# Patient Record
Sex: Male | Born: 1980
Health system: Southern US, Community
[De-identification: ages and names within clinical notes are randomized; demographics above are authoritative.]

## PROBLEM LIST (undated history)

## (undated) DIAGNOSIS — J189 Pneumonia, unspecified organism: Secondary | ICD-10-CM

## (undated) DIAGNOSIS — S82142A Displaced bicondylar fracture of left tibia, initial encounter for closed fracture: Secondary | ICD-10-CM

## (undated) HISTORY — PX: ARTERY REPAIR: SHX559

## (undated) HISTORY — PX: HERNIA REPAIR: SHX51

## (undated) HISTORY — PX: WISDOM TOOTH EXTRACTION: SHX21

---

## 2002-11-15 ENCOUNTER — Encounter: Payer: Self-pay | Admitting: Emergency Medicine

## 2002-11-15 ENCOUNTER — Emergency Department (HOSPITAL_COMMUNITY): Admission: EM | Admit: 2002-11-15 | Discharge: 2002-11-15 | Payer: Self-pay | Admitting: Emergency Medicine

## 2012-07-08 ENCOUNTER — Encounter: Payer: Self-pay | Admitting: Emergency Medicine

## 2012-07-08 ENCOUNTER — Emergency Department
Admission: EM | Admit: 2012-07-08 | Discharge: 2012-07-08 | Disposition: A | Discharge status: Home / Self Care | Payer: 59 | Source: Home / Self Care | Attending: Family Medicine | Admitting: Family Medicine

## 2012-07-08 DIAGNOSIS — H1130 Conjunctival hemorrhage, unspecified eye: Secondary | ICD-10-CM

## 2012-07-08 DIAGNOSIS — H1132 Conjunctival hemorrhage, left eye: Secondary | ICD-10-CM

## 2012-07-08 MED ORDER — KETOROLAC TROMETHAMINE 0.5 % OP SOLN: 1.0000 [drp] | Freq: Four times a day (QID) | OPHTHALMIC | Status: DC

## 2012-07-08 NOTE — ED Notes (Signed)
Patient believes he got something like hair or dust in his right eye last evening; used 'visine', but it 'stung'. Was not working with metal or wood.

## 2012-07-08 NOTE — ED Provider Notes (Signed)
History     CSN: [id number]  Arrival date & time 07/08/12  1831   First MD Initiated Contact with Patient 07/08/12 1931      Chief Complaint  Patient presents with  . Foreign Body in Eye       HPI Comments: Yesterday evening patient developed a sudden foreign body sensation in the medial aspect of his right eye.  The discomfort has persisted.  He had no improvement after usin Visine eye drops.  No change in vision  Patient is a 32 y.o. male presenting with eye pain. The history is provided by the patient.  Eye Pain This is a new problem. The current episode started yesterday. The problem occurs constantly. The problem has not changed since onset.Associated symptoms comments: none. Exacerbated by: blinking right eye. Nothing relieves the symptoms. Treatments tried: Visine drops. The treatment provided no relief.    History reviewed. No pertinent past medical history.  Past Surgical History  Procedure Laterality Date  . Artery repair      right wrist  . Hernia repair      History reviewed. No pertinent family history.  History  Substance Use Topics  . Smoking status: Former Games developer  . Smokeless tobacco: Not on file  . Alcohol Use: No      Review of Systems  Eyes: Positive for pain.  All other systems reviewed and are negative.    Allergies  Ceclor  Home Medications   Current Outpatient Rx  Name  Route  Sig  Dispense  Refill  . ketorolac (ACULAR) 0.5 % ophthalmic solution   Left Eye   Place 1 drop into the left eye 4 (four) times daily.   3 mL   0     BP 109/68  Pulse 76  Temp(Src) 98 F (36.7 C) (Oral)  Ht 6\' 1"  (1.854 m)  Wt 235 lb (106.595 kg)  BMI 31.01 kg/m2  SpO2 98%  Physical Exam  Nursing note and vitals reviewed. Constitutional: He appears well-developed and well-nourished. No distress.  HENT:  Head: Normocephalic.  Eyes: EOM and lids are normal. Pupils are equal, round, and reactive to light. No foreign bodies found. Right eye exhibits  no chemosis, no discharge, no exudate and no hordeolum. No foreign body present in the right eye. Left eye exhibits no discharge. Right conjunctiva is not injected. Right conjunctiva has a hemorrhage. No scleral icterus.    Over the medial conjunctivae, as noted on diagram, is a hyperemic area that is fluorescein positive.  No lid swelling or tenderness.   No photophobia.    ED Course  Procedures  none      1. Subconjunctival hemorrhage, left; could also be inflamed pterygium      MDM  Begin Acular Followup with ophthalmologist if not improved one week.        Lattie Haw, MD 07/13/12 1249

## 2013-10-04 ENCOUNTER — Emergency Department
Admission: EM | Admit: 2013-10-04 | Discharge: 2013-10-04 | Disposition: A | Discharge status: Home / Self Care | Payer: 59 | Source: Home / Self Care | Attending: Family Medicine | Admitting: Family Medicine

## 2013-10-04 ENCOUNTER — Emergency Department (INDEPENDENT_AMBULATORY_CARE_PROVIDER_SITE_OTHER): Payer: 59

## 2013-10-04 ENCOUNTER — Ambulatory Visit (INDEPENDENT_AMBULATORY_CARE_PROVIDER_SITE_OTHER): Payer: 59 | Admitting: Sports Medicine

## 2013-10-04 ENCOUNTER — Encounter: Payer: Self-pay | Admitting: Emergency Medicine

## 2013-10-04 DIAGNOSIS — M25469 Effusion, unspecified knee: Secondary | ICD-10-CM

## 2013-10-04 DIAGNOSIS — M25569 Pain in unspecified knee: Secondary | ICD-10-CM

## 2013-10-04 DIAGNOSIS — M222X1 Patellofemoral disorders, right knee: Secondary | ICD-10-CM

## 2013-10-04 DIAGNOSIS — M25461 Effusion, right knee: Secondary | ICD-10-CM | POA: Insufficient documentation

## 2013-10-04 NOTE — Assessment & Plan Note (Addendum)
Likely represents patellofemoral chondromalacia. Aspiration of 52 cc, injection, formal PT. Return to see me in a month.

## 2013-10-04 NOTE — ED Provider Notes (Signed)
CSN: [id number]     Arrival date & time 10/04/13  1423 History   First MD Initiated Contact with Patient 10/04/13 1432     Chief Complaint  Patient presents with  . Knee Pain      HPI Comments: Patient recalls climbing multiple stairs about two weeks ago.  Since then he has had persistent tightness and discomfort in his right knee.  He recalls no injury.  He admits that he has gained about 30 pounds over the past two years.  Patient is a 33 y.o. male presenting with knee pain. The history is provided by the patient.  Knee Pain Location:  Knee Time since incident:  2 weeks Injury: no   Knee location:  R knee Pain details:    Quality:  Aching   Radiates to:  Does not radiate   Severity:  Mild   Onset quality:  Gradual   Duration:  2 weeks   Timing:  Constant   Progression:  Unchanged Chronicity:  New Dislocation: no   Prior injury to area:  No Worsened by:  Flexion Ineffective treatments:  None tried Associated symptoms: decreased ROM, stiffness and swelling   Associated symptoms: no back pain, no fever, no muscle weakness, no numbness and no tingling     History reviewed. No pertinent past medical history. Past Surgical History  Procedure Laterality Date  . Artery repair      right wrist  . Hernia repair     History reviewed. No pertinent family history. History  Substance Use Topics  . Smoking status: Former Games developer  . Smokeless tobacco: Never Used  . Alcohol Use: Yes     Comment: 2-3    Review of Systems  Constitutional: Negative for fever.  Musculoskeletal: Positive for stiffness. Negative for back pain.  All other systems reviewed and are negative.   Allergies  Ceclor  Home Medications   Prior to Admission medications   Medication Sig Start Date End Date Taking? Authorizing Provider  ketorolac (ACULAR) 0.5 % ophthalmic solution Place 1 drop into the left eye 4 (four) times daily. 07/08/12   Lattie Haw, MD   BP 121/79  Pulse 71  Temp(Src) 98.1 F  (36.7 C) (Oral)  Ht 6\' 1"  (1.854 m)  Wt 258 lb 8 oz (117.255 kg)  BMI 34.11 kg/m2  SpO2 98% Physical Exam  Nursing note and vitals reviewed. Constitutional: He is oriented to person, place, and time. He appears well-developed and well-nourished. No distress.  Patient is obese (BMI 34.1)  HENT:  Head: Normocephalic.  Eyes: Conjunctivae are normal. Pupils are equal, round, and reactive to light.  Musculoskeletal:       Right knee: He exhibits effusion and abnormal meniscus. He exhibits normal range of motion, no swelling, no ecchymosis, no deformity, no erythema, no LCL laxity, normal patellar mobility and no MCL laxity. Tenderness found. No medial joint line, no lateral joint line, no MCL, no LCL and no patellar tendon tenderness noted.       Legs: Right knee has mild tenderness to palpation and crepitance just superior to the patella as noted on diagram.    Neurological: He is alert and oriented to person, place, and time.  Skin: Skin is warm and dry. No erythema.    ED Course  Procedures  none     Imaging Review Dg Knee 1-2 Views Right  10/04/2013   CLINICAL DATA:  33 year old male with pain above the patella and swelling with limited range of motion. Initial encounter.  EXAM: RIGHT KNEE - 1-2 VIEW  COMPARISON:  None.  FINDINGS: Small to moderate joint effusion. Patella appears intact. Bone mineralization is within normal limits. Joint spaces appear preserved. No acute fracture or dislocation identified.  IMPRESSION: Moderate joint effusion with no acute osseous abnormality identified at the right knee.   Electronically Signed   By: Augusto GambleLee  Hall M.D.   On: 10/04/2013 15:10     MDM   1. Right patellofemoral syndrome    With the presence of a significant joint effusion, will refer to Dr. Rodney Langtonhomas Thekkekandam for ultrasound guided joint aspiration and further management.    Lattie HawStephen A Yuma Pacella, MD 10/06/13 [id number]

## 2013-10-04 NOTE — ED Notes (Signed)
R knee pain beginning approx 2 weeks ago.  C/O tightness and it feels like it's swollen but nothing is visible.  Has not noticed any warmth in area. Denies injury.  Stands most of day at work. Decreased ROM

## 2013-10-04 NOTE — Progress Notes (Signed)
   Subjective:    I'm seeing this patient as a consultation for:  Dr. Cathren Harsh  CC: Right knee pain  HPI: This is a very pleasant 33 year old male, over the past several months he's gained some weight, more recently went on a hike, and had to seek a pain under his patella, worse with going up and down stairs, with significant swelling but no mechanical symptoms. Pain is moderate, persistent.  Past medical history, Surgical history, Family history not pertinant except as noted below, Social history, Allergies, and medications have been entered into the medical record, reviewed, and no changes needed.   Review of Systems: No headache, visual changes, nausea, vomiting, diarrhea, constipation, dizziness, abdominal pain, skin rash, fevers, chills, night sweats, weight loss, swollen lymph nodes, body aches, joint swelling, muscle aches, chest pain, shortness of breath, mood changes, visual or auditory hallucinations.   Objective:   General: Well Developed, well nourished, and in no acute distress.  Neuro/Psych: Alert and oriented x3, extra-ocular muscles intact, able to move all 4 extremities, sensation grossly intact. Skin: Warm and dry, no rashes noted.  Respiratory: Not using accessory muscles, speaking in full sentences, trachea midline.  Cardiovascular: Pulses palpable, no extremity edema. Abdomen: Does not appear distended. Right Knee: Visible and palpable effusion with a fluid wave. ROM full in flexion and extension and lower leg rotation. Ligaments with solid consistent endpoints including ACL, PCL, LCL, MCL. Negative Mcmurray's, Apley's, and Thessalonian tests. Non painful patellar compression. Tender to palpation under the lateral patellar facet Patellar glide without crepitus. Patellar and quadriceps tendons unremarkable. Hamstring and quadriceps strength is normal.   Procedure: Real-time Ultrasound Guided Injection of right knee Device: GE Logiq E  Verbal informed consent  obtained.  Time-out conducted.  Noted no overlying erythema, induration, or other signs of local infection.  Skin prepped in a sterile fashion.  Local anesthesia: Topical Ethyl chloride.  With sterile technique and under real time ultrasound guidance:  22-gauge needle advanced into the suprapatellar recess, 52 cc of straw-colored fluid was aspirated, syringe switched and 2 cc Kenalog 40, 4 cc lidocaine injected easily. Completed without difficulty  Pain immediately resolved suggesting accurate placement of the medication.  Advised to call if fevers/chills, erythema, induration, drainage, or persistent bleeding.  Images permanently stored and available for review in the ultrasound unit.  Impression: Technically successful ultrasound guided injection.  Knee x-rays were reviewed and showed an effusion but no arthritis, there was no patellar sunrise view.  Impression and Recommendations:   This case required medical decision making of moderate complexity.

## 2013-10-06 NOTE — Discharge Instructions (Signed)

## 2014-02-07 ENCOUNTER — Encounter: Payer: Self-pay | Admitting: Emergency Medicine

## 2014-02-07 ENCOUNTER — Emergency Department
Admission: EM | Admit: 2014-02-07 | Discharge: 2014-02-07 | Disposition: A | Discharge status: Home / Self Care | Payer: 59 | Source: Home / Self Care | Attending: Emergency Medicine | Admitting: Emergency Medicine

## 2014-02-07 DIAGNOSIS — J029 Acute pharyngitis, unspecified: Secondary | ICD-10-CM

## 2014-02-07 LAB — POCT RAPID STREP A (OFFICE): Rapid Strep A Screen: NEGATIVE

## 2014-02-07 MED ORDER — AMOXICILLIN 500 MG PO CAPS: 500.0000 mg | ORAL_CAPSULE | Freq: Three times a day (TID) | ORAL | Status: DC

## 2014-02-07 NOTE — ED Provider Notes (Signed)
CSN: [id number]     Arrival date & time 02/07/14  1123 History   First MD Initiated Contact with Patient 02/07/14 1225     Chief Complaint  Patient presents with  . Sore Throat  . Nasal Congestion   (Consider location/radiation/quality/duration/timing/severity/associated sxs/prior Treatment) Patient is a 33 y.o. male presenting with pharyngitis. The history is provided by the patient. No language interpreter was used.  Sore Throat This is a new problem. The problem occurs constantly. The problem has been gradually worsening. Pertinent negatives include no shortness of breath. Nothing aggravates the symptoms. Nothing relieves the symptoms. He has tried nothing for the symptoms. The treatment provided moderate relief.    History reviewed. No pertinent past medical history. Past Surgical History  Procedure Laterality Date  . Artery repair      right wrist  . Hernia repair     History reviewed. No pertinent family history. History  Substance Use Topics  . Smoking status: Former Games developermoker  . Smokeless tobacco: Never Used  . Alcohol Use: Yes     Comment: 2-3    Review of Systems  HENT: Positive for ear pain.   Respiratory: Negative for shortness of breath.   All other systems reviewed and are negative.   Allergies  Ceclor  Home Medications   Prior to Admission medications   Medication Sig Start Date End Date Taking? Authorizing Provider  ketorolac (ACULAR) 0.5 % ophthalmic solution Place 1 drop into the left eye 4 (four) times daily. 07/08/12   Lattie HawStephen A Beese, MD   BP 125/83  Pulse 86  Temp(Src) 97.8 F (36.6 C) (Oral)  Resp 18  Ht 6\' 3"  (1.905 m)  Wt 263 lb (119.296 kg)  BMI 32.87 kg/m2  SpO2 98% Physical Exam  Nursing note and vitals reviewed. Constitutional: He is oriented to person, place, and time. He appears well-developed and well-nourished.  HENT:  Head: Normocephalic and atraumatic.  Eyes: Conjunctivae and EOM are normal. Pupils are equal, round, and  reactive to light.  Neck: Normal range of motion.  Cardiovascular: Normal rate.   Pulmonary/Chest: Effort normal.  Abdominal: Soft. He exhibits no distension.  Musculoskeletal: Normal range of motion.  Neurological: He is alert and oriented to person, place, and time.  Skin: Skin is warm.  Psychiatric: He has a normal mood and affect.    ED Course  Procedures (including critical care time) Labs Review Labs Reviewed  POCT RAPID STREP A (OFFICE)  strep negative  Imaging Review No results found.   MDM   1. Acute pharyngitis, unspecified pharyngitis type    Zyrtec Tylenol amoxicillian      Elson AreasLeslie K Sofia, PA-C 02/07/14 1513

## 2014-02-07 NOTE — ED Notes (Signed)
Pt c/o sore throat and nasal congestion x 1 day. Denies fever. He reports that his daughter had strep 2 wks ago.

## 2014-02-07 NOTE — Discharge Instructions (Signed)

## 2014-02-10 NOTE — ED Provider Notes (Signed)
Medical history/examination/treatment/procedure(s) were performed by non-physician provider and as supervising physician I was immediately available for consultation/collaboration.  David Massey, MD 02/10/14 1124 

## 2014-04-07 ENCOUNTER — Ambulatory Visit (INDEPENDENT_AMBULATORY_CARE_PROVIDER_SITE_OTHER): Payer: 59 | Admitting: Sports Medicine

## 2014-04-07 ENCOUNTER — Encounter: Payer: Self-pay | Admitting: Sports Medicine

## 2014-04-07 VITALS — BP 130/72 | HR 77 | Ht 75.0 in | Wt 263.0 lb

## 2014-04-07 DIAGNOSIS — Z0000 Encounter for general adult medical examination without abnormal findings: Secondary | ICD-10-CM | POA: Insufficient documentation

## 2014-04-07 DIAGNOSIS — M25461 Effusion, right knee: Secondary | ICD-10-CM

## 2014-04-07 DIAGNOSIS — E291 Testicular hypofunction: Secondary | ICD-10-CM

## 2014-04-07 MED ORDER — MELOXICAM 15 MG PO TABS: ORAL_TABLET | ORAL | Status: DC

## 2014-04-07 NOTE — Assessment & Plan Note (Signed)
Checking routine bloodwork. 

## 2014-04-07 NOTE — Assessment & Plan Note (Signed)
Seen 6 months ago in urgent care, aspiration of 52 mL and injection for chondromalacia patellae. Unfortunately did not do physical therapy. Now 6 months later having a recurrence of pain, repeat aspiration and injection as above, formal physical therapy this time.  Return to see me in 6 weeks.

## 2014-04-07 NOTE — Progress Notes (Signed)
  Subjective:    CC: Right knee pain  HPI: I haven't seen Jeffery Cooley in about 6 months, I saw him in urgent care with a knee effusion, I aspirated and injected his knee, and referred him to physical therapy, he never did any physical therapy but his pain completely went away for 6 months. He has a recurrence of pain under the lateral patellar facet, moderate, persistent without radiation.  Past medical history, Surgical history, Family history not pertinant except as noted below, Social history, Allergies, and medications have been entered into the medical record, reviewed, and no changes needed.   Review of Systems: No fevers, chills, night sweats, weight loss, chest pain, or shortness of breath.   Objective:    General: Well Developed, well nourished, and in no acute distress.  Neuro: Alert and oriented x3, extra-ocular muscles intact, sensation grossly intact.  HEENT: Normocephalic, atraumatic, pupils equal round reactive to light, neck supple, no masses, no lymphadenopathy, thyroid nonpalpable.  Skin: Warm and dry, no rashes. Cardiac: Regular rate and rhythm, no murmurs rubs or gallops, no lower extremity edema.  Respiratory: Clear to auscultation bilaterally. Not using accessory muscles, speaking in full sentences. Right Knee: Tender to palpation under the lateral patellar facet, minimal effusion. ROM normal in flexion and extension and lower leg rotation. Ligaments with solid consistent endpoints including ACL, PCL, LCL, MCL. Negative Mcmurray's and provocative meniscal tests. Non painful patellar compression. Patellar and quadriceps tendons unremarkable. Hamstring and quadriceps strength is normal.  Procedure: Real-time Ultrasound Guided aspiration/Injection of right knee Device: GE Logiq E  Verbal informed consent obtained.  Time-out conducted.  Noted no overlying erythema, induration, or other signs of local infection.  Skin prepped in a sterile fashion.  Local anesthesia: Topical  Ethyl chloride.  With sterile technique and under real time ultrasound guidance:  Aspirated 5 mL of straw-colored fluid, syringe switched and 2 mL kenalog 40, 4 mL lidocaine injected easily. Completed without difficulty  Pain immediately resolved suggesting accurate placement of the medication.  Advised to call if fevers/chills, erythema, induration, drainage, or persistent bleeding.  Images permanently stored and available for review in the ultrasound unit.  Impression: Technically successful ultrasound guided injection.  Impression and Recommendations:

## 2014-04-08 LAB — CBC
HCT: 44.9 % (ref 39.0–52.0)
Hemoglobin: 15.8 g/dL (ref 13.0–17.0)
MCH: 30.1 pg (ref 26.0–34.0)
MCHC: 35.2 g/dL (ref 30.0–36.0)
MCV: 85.5 fL (ref 78.0–100.0)
MPV: 8.8 fL — ABNORMAL LOW (ref 9.4–12.4)
Platelets: 239 10*3/uL (ref 150–400)
RBC: 5.25 MIL/uL (ref 4.22–5.81)
RDW: 13.2 % (ref 11.5–15.5)
WBC: 8.4 10*3/uL (ref 4.0–10.5)

## 2014-04-08 LAB — COMPREHENSIVE METABOLIC PANEL
ALT: 47 U/L (ref 0–53)
AST: 23 U/L (ref 0–37)
Albumin: 5.1 g/dL (ref 3.5–5.2)
BUN: 14 mg/dL (ref 6–23)
Calcium: 9.5 mg/dL (ref 8.4–10.5)
Creat: 0.74 mg/dL (ref 0.50–1.35)
Glucose, Bld: 119 mg/dL — ABNORMAL HIGH (ref 70–99)
Sodium: 138 mEq/L (ref 135–145)
Total Protein: 7.5 g/dL (ref 6.0–8.3)

## 2014-04-08 LAB — COMPREHENSIVE METABOLIC PANEL WITH GFR
Alkaline Phosphatase: 41 U/L (ref 39–117)
CO2: 24 meq/L (ref 19–32)
Chloride: 100 meq/L (ref 96–112)
Potassium: 4.1 meq/L (ref 3.5–5.3)
Total Bilirubin: 0.5 mg/dL (ref 0.2–1.2)

## 2014-04-08 LAB — LIPID PANEL
Cholesterol: 171 mg/dL (ref 0–200)
HDL: 61 mg/dL (ref >39)
LDL Cholesterol: 101 mg/dL — ABNORMAL HIGH (ref 0–99)
Total CHOL/HDL Ratio: 2.8 ratio
Triglycerides: 47 mg/dL (ref <150)
VLDL: 9 mg/dL (ref 0–40)

## 2014-04-08 LAB — TSH: TSH: 0.524 u[IU]/mL (ref 0.350–4.500)

## 2014-04-09 LAB — HEMOGLOBIN A1C
Hgb A1c MFr Bld: 5.4 % (ref <5.7)
Mean Plasma Glucose: 108 mg/dL (ref <117)

## 2014-04-09 LAB — TESTOSTERONE: Testosterone: 42 ng/dL — ABNORMAL LOW (ref 300–890)

## 2014-04-09 LAB — VITAMIN D 25 HYDROXY (VIT D DEFICIENCY, FRACTURES): Vit D, 25-Hydroxy: 19 ng/mL — ABNORMAL LOW (ref 30–100)

## 2014-04-11 DIAGNOSIS — E230 Hypopituitarism: Secondary | ICD-10-CM | POA: Insufficient documentation

## 2014-04-11 MED ORDER — VITAMIN D (ERGOCALCIFEROL) 1.25 MG (50000 UNIT) PO CAPS: 50000.0000 [IU] | ORAL_CAPSULE | ORAL | Status: DC

## 2014-04-11 NOTE — Assessment & Plan Note (Addendum)
Testosterone levels are remarkably low for a man of his age. Adding FSH and LH.   FSH and LH are relatively low for this to be primary hypogonadism, I would like him to touch base with endocrinology. I do wonder if there is an element of hypogonadotrophic hypogonadism.

## 2014-04-11 NOTE — Addendum Note (Signed)
Addended by: Monica BectonHEKKEKANDAM, Miriya Cloer J on: 04/11/2014 12:37 PM   Modules accepted: Orders

## 2014-04-15 ENCOUNTER — Ambulatory Visit (INDEPENDENT_AMBULATORY_CARE_PROVIDER_SITE_OTHER): Payer: 59 | Admitting: Physical Therapy

## 2014-04-15 DIAGNOSIS — M25569 Pain in unspecified knee: Secondary | ICD-10-CM

## 2014-04-15 DIAGNOSIS — M25461 Effusion, right knee: Secondary | ICD-10-CM

## 2014-05-19 ENCOUNTER — Ambulatory Visit (INDEPENDENT_AMBULATORY_CARE_PROVIDER_SITE_OTHER): Payer: 59 | Admitting: Sports Medicine

## 2014-05-19 ENCOUNTER — Encounter: Payer: Self-pay | Admitting: Sports Medicine

## 2014-05-19 VITALS — BP 127/70 | HR 78 | Ht 75.0 in | Wt 260.0 lb

## 2014-05-19 DIAGNOSIS — M25461 Effusion, right knee: Secondary | ICD-10-CM

## 2014-05-19 DIAGNOSIS — E291 Testicular hypofunction: Secondary | ICD-10-CM

## 2014-05-19 MED ORDER — DICLOFENAC SODIUM 2 % TD SOLN: 40.0000 mg | Freq: Two times a day (BID) | TRANSDERMAL | Status: DC

## 2014-05-19 MED ORDER — DICLOFENAC SODIUM 2 % TD SOLN: 2.0000 | Freq: Two times a day (BID) | TRANSDERMAL | Status: DC

## 2014-05-19 NOTE — Progress Notes (Signed)
  Subjective:    CC: Follow-up  HPI: Right knee osteoarthritis: Continues to do well after aspiration and injection month ago.  Male hypogonadism: He does endorse poor energy, poor drive, no sexual dysfunction.  Denies any depressive symptoms  Past medical history, Surgical history, Family history not pertinant except as noted below, Social history, Allergies, and medications have been entered into the medical record, reviewed, and no changes needed.   Review of Systems: No fevers, chills, night sweats, weight loss, chest pain, or shortness of breath.   Objective:    General: Well Developed, well nourished, and in no acute distress.  Neuro: Alert and oriented x3, extra-ocular muscles intact, sensation grossly intact.  HEENT: Normocephalic, atraumatic, pupils equal round reactive to light, neck supple, no masses, no lymphadenopathy, thyroid nonpalpable.  Skin: Warm and dry, no rashes. Cardiac: Regular rate and rhythm, no murmurs rubs or gallops, no lower extremity edema.  Respiratory: Clear to auscultation bilaterally. Not using accessory muscles, speaking in full sentences. Right Knee: Very mild effusion with no tenderness. Palpation normal with no warmth or joint line tenderness or patellar tenderness or condyle tenderness. ROM normal in flexion and extension and lower leg rotation. Ligaments with solid consistent endpoints including ACL, PCL, LCL, MCL. Negative Mcmurray's and provocative meniscal tests. Non painful patellar compression. Patellar and quadriceps tendons unremarkable. Hamstring and quadriceps strength is normal.  Impression and Recommendations:

## 2014-05-19 NOTE — Assessment & Plan Note (Signed)
Does have some symptoms of male hypogonadism. We discussed weight loss in an effort to improve testosterone however with a level of 40 do not think he will get sufficiently high. He did [facility] L[facility]SH and LH obtained today to evaluate for secondary source, but the source his primary we will start supplementation, risks and benefits discussed.  He is going to research whether he would like to try topical or an injectable. We are also obtaining a PHQ9 to evaluate if his symptoms may be related to depression.

## 2014-05-19 NOTE — Assessment & Plan Note (Signed)
Doing well after injection. 

## 2014-05-20 LAB — FOLLICLE STIMULATING HORMONE: FSH: 4.8 m[IU]/mL (ref 1.4–18.1)

## 2014-05-20 LAB — LUTEINIZING HORMONE: LH: 2.1 m[IU]/mL (ref 1.5–9.3)

## 2014-05-22 NOTE — Addendum Note (Signed)
Addended by: Monica BectonHEKKEKANDAM, THOMAS J on: 05/22/2014 08:21 PM   Modules accepted: Orders

## 2014-05-31 ENCOUNTER — Encounter: Payer: Self-pay | Admitting: Endocrinology

## 2014-05-31 ENCOUNTER — Ambulatory Visit (INDEPENDENT_AMBULATORY_CARE_PROVIDER_SITE_OTHER): Payer: 59 | Admitting: Endocrinology

## 2014-05-31 VITALS — BP 136/88 | HR 80 | Temp 98.9°F | Ht 75.0 in | Wt 263.0 lb

## 2014-05-31 DIAGNOSIS — E291 Testicular hypofunction: Secondary | ICD-10-CM

## 2014-05-31 LAB — BASIC METABOLIC PANEL
BUN: 14 mg/dL (ref 6–23)
CALCIUM: 9.3 mg/dL (ref 8.4–10.5)
CHLORIDE: 102 meq/L (ref 96–112)
CO2: 32 meq/L (ref 19–32)
Creatinine, Ser: 0.87 mg/dL (ref 0.40–1.50)
GFR: 105.71 mL/min (ref >60.00)
Glucose, Bld: 86 mg/dL (ref 70–99)
POTASSIUM: 3.7 meq/L (ref 3.5–5.1)
Sodium: 138 mEq/L (ref 135–145)

## 2014-05-31 LAB — TESTOSTERONE: Testosterone: 184.8 ng/dL — ABNORMAL LOW (ref 300.00–890.00)

## 2014-05-31 NOTE — Patient Instructions (Signed)
blood tests are being requested for you today.  We'll let you know about the results. Let's also check the MRI.  you will receive a phone call, about a day and time for an appointment.

## 2014-05-31 NOTE — Progress Notes (Signed)
Subjective:    Patient ID: Jeffery Cooley, male    DOB: April 20, 1981, 34 y.o.   MRN: [id number]  HPI Pt reports he had puberty at the normal age.  He has 2 biological children.  He says he has never taken illicit androgens.  He has never been on any prescribed medication for hypogonadism.  He denies any h/o infertility.  He has never had surgery, or a serious injury to the head or genital area.  He reports 1-2 years of slightly decreased strength throughout the body, and assoc mood swings.   No past medical history on file.  Past Surgical History  Procedure Laterality Date  . Artery repair      right wrist  . Hernia repair      History   Social History  . Marital Status: Single    Spouse Name: N/A    Number of Children: N/A  . Years of Education: N/A   Occupational History  . Not on file.   Social History Main Topics  . Smoking status: Former Games developer  . Smokeless tobacco: Never Used  . Alcohol Use: Yes     Comment: 2-3  . Drug Use: No  . Sexual Activity: Not on file   Other Topics Concern  . Not on file   Social History Narrative    Current Outpatient Prescriptions on File Prior to Visit  Medication Sig Dispense Refill  . Diclofenac Sodium 2 % SOLN Place 2 sprays onto the skin 2 (two) times daily. 1 Bottle 11  . Diclofenac Sodium 2 % SOLN Apply 40 mg topically 2 (two) times daily. Apply to affected area 2 times a day. 2 Bottle 0  . meloxicam (MOBIC) 15 MG tablet One tab PO qAM with breakfast for 2 weeks, then daily prn pain. 30 tablet 3  . Vitamin D, Ergocalciferol, (DRISDOL) 50000 UNITS CAPS capsule Take 1 capsule (50,000 Units total) by mouth every 7 (seven) days. 8 capsule 0   No current facility-administered medications on file prior to visit.    Allergies  Allergen Reactions  . Ceclor [Cefaclor]     No family history on file.  BP 136/88 mmHg  Pulse 80  Temp(Src) 98.9 F (37.2 C) (Oral)  Ht  (1.905 m)  Wt 263 lb (119.296 kg)  BMI 32.87 kg/m2   SpO2 96%  Review of Systems denies headache, fever, diarrhea, rash, visual loss, abdominal pain, sob, urinary frequency, gynecomastia, excessive diaphoresis, ED sxs, n/v, rhinorrhea, easy bruising, and numbness.  He has weight gain, arthralgias, and depression.    Objective:   Physical Exam VS: see vs page GEN: no distress HEAD: head: no deformity eyes: no periorbital swelling, no proptosis external nose and ears are normal mouth: no lesion seen NECK: supple, thyroid is not enlarged CHEST WALL: no deformity LUNGS: clear to auscultation BREASTS:  No gynecomastia CV: reg rate and rhythm, no murmur ABD: abdomen is soft, nontender.  no hepatosplenomegaly.  not distended.  no hernia GENITALIA:  Normal male.   MUSCULOSKELETAL: muscle bulk and strength are grossly normal.  no obvious joint swelling.  gait is normal and steady EXTEMITIES: no deformity.  no ulcer on the feet.  feet are of normal color and temp.  no edema PULSES: dorsalis pedis intact bilat.  no carotid bruit NEURO:  cn 2-12 grossly intact.   readily moves all 4's.  sensation is intact to touch on the feet SKIN:  Normal texture and temperature.  No rash or suspicious lesion is visible.  NODES:  None palpable at the neck PSYCH: alert, well-oriented.  Does not appear anxious nor depressed.    Lab Results  Component Value Date   TESTOSTERONE 184.80* 05/31/2014   FSH and LH are normal    Assessment & Plan:  Hypogonadism, secondary, severe. Mood swings, new, uncertain if related to hpogonadism    Patient is advised the following: Patient Instructions  blood tests are being requested for you today.  We'll let you know about the results. Let's also check the MRI.  you will receive a phone call, about a day and time for an appointment.

## 2014-06-01 LAB — PROLACTIN: Prolactin: 7.5 ng/mL (ref 2.1–17.1)

## 2014-06-02 ENCOUNTER — Encounter: Payer: Self-pay | Admitting: Endocrinology

## 2014-06-06 ENCOUNTER — Other Ambulatory Visit: Payer: Self-pay | Admitting: Endocrinology

## 2014-06-06 ENCOUNTER — Ambulatory Visit (INDEPENDENT_AMBULATORY_CARE_PROVIDER_SITE_OTHER): Payer: 59

## 2014-06-06 ENCOUNTER — Encounter: Payer: Self-pay | Admitting: Endocrinology

## 2014-06-06 DIAGNOSIS — Z1389 Encounter for screening for other disorder: Secondary | ICD-10-CM

## 2014-06-06 DIAGNOSIS — E291 Testicular hypofunction: Secondary | ICD-10-CM

## 2014-06-06 MED ORDER — CLOMIPHENE CITRATE 50 MG PO TABS: ORAL_TABLET | ORAL | Status: DC

## 2014-06-06 MED ORDER — GADOBENATE DIMEGLUMINE 529 MG/ML IV SOLN
10.0000 mL | Freq: Once | INTRAVENOUS | Status: AC | PRN
  Administered 2014-06-06: 10 mL via INTRAVENOUS

## 2014-07-04 ENCOUNTER — Ambulatory Visit (INDEPENDENT_AMBULATORY_CARE_PROVIDER_SITE_OTHER): Payer: 59 | Admitting: Endocrinology

## 2014-07-04 ENCOUNTER — Encounter: Payer: Self-pay | Admitting: Endocrinology

## 2014-07-04 VITALS — BP 108/70 | HR 73 | Temp 98.6°F | Wt 263.8 lb

## 2014-07-04 DIAGNOSIS — E291 Testicular hypofunction: Secondary | ICD-10-CM

## 2014-07-04 LAB — CBC WITH DIFFERENTIAL/PLATELET
BASOS ABS: 0 10*3/uL (ref 0.0–0.1)
Basophils Relative: 0.5 % (ref 0.0–3.0)
EOS PCT: 1 % (ref 0.0–5.0)
Eosinophils Absolute: 0.1 10*3/uL (ref 0.0–0.7)
HEMATOCRIT: 42.9 % (ref 39.0–52.0)
Hemoglobin: 14.9 g/dL (ref 13.0–17.0)
LYMPHS ABS: 1.5 10*3/uL (ref 0.7–4.0)
Lymphocytes Relative: 28.6 % (ref 12.0–46.0)
MCHC: 34.8 g/dL (ref 30.0–36.0)
MCV: 87.6 fl (ref 78.0–100.0)
Monocytes Absolute: 0.5 10*3/uL (ref 0.1–1.0)
Monocytes Relative: 9.3 % (ref 3.0–12.0)
NEUTROS ABS: 3.3 10*3/uL (ref 1.4–7.7)
Neutrophils Relative %: 60.6 % (ref 43.0–77.0)
PLATELETS: 224 10*3/uL (ref 150.0–400.0)
RBC: 4.9 Mil/uL (ref 4.22–5.81)
RDW: 12.7 % (ref 11.5–15.5)
WBC: 5.4 10*3/uL (ref 4.0–10.5)

## 2014-07-04 LAB — TESTOSTERONE: Testosterone: 316.22 ng/dL (ref 300.00–890.00)

## 2014-07-04 NOTE — Patient Instructions (Addendum)
blood tests are being requested for you today.  We'll let you know about the results. Please come back for a follow-up appointment in 6 months.   normalization of testosterone is not known to harm you.  however, there are "theoretical" risks, including increased fertility, hair loss, prostate cancer, benign prostate enlargement, blood clots, liver problems, lower hdl ("good cholesterol"), polycythemia (opposite of anemia), sleep apnea, and behavior changes Weight loss also helps the testosterone level.

## 2014-07-04 NOTE — Progress Notes (Signed)
Pre visit review using our clinic review tool, if applicable. No additional management support is needed unless otherwise documented below in the visit note. 

## 2014-07-04 NOTE — Progress Notes (Signed)
   Subjective:    Patient ID: Jeffery Cooley, male    DOB: [birth year], 34 y.o.   MRN: [id number]  HPI Pt returns for f/u of idiopathic central hypogonadism (dx'ed early 2016; he has 2 biological children, and is considering having more; pituitary MRI was normal).  Since on the clomid, pt states he feels better in general.   No past medical history on file.  Past Surgical History  Procedure Laterality Date  . Artery repair      right wrist  . Hernia repair      History   Social History  . Marital Status: Single    Spouse Name: N/A  . Number of Children: N/A  . Years of Education: N/A   Occupational History  . Not on file.   Social History Main Topics  . Smoking status: Former Games developermoker  . Smokeless tobacco: Never Used  . Alcohol Use: Yes     Comment: 2-3  . Drug Use: No  . Sexual Activity: Not on file   Other Topics Concern  . Not on file   Social History Narrative    Current Outpatient Prescriptions on File Prior to Visit  Medication Sig Dispense Refill  . clomiPHENE (CLOMID) 50 MG tablet 1/4 tab daily 10 tablet 3  . Diclofenac Sodium 2 % SOLN Place 2 sprays onto the skin 2 (two) times daily. 1 Bottle 11  . Diclofenac Sodium 2 % SOLN Apply 40 mg topically 2 (two) times daily. Apply to affected area 2 times a day. 2 Bottle 0  . meloxicam (MOBIC) 15 MG tablet One tab PO qAM with breakfast for 2 weeks, then daily prn pain. 30 tablet 3  . Vitamin D, Ergocalciferol, (DRISDOL) 50000 UNITS CAPS capsule Take 1 capsule (50,000 Units total) by mouth every 7 (seven) days. 8 capsule 0   No current facility-administered medications on file prior to visit.    Allergies  Allergen Reactions  . Ceclor [Cefaclor]     Family History  Problem Relation Age of Onset  . Infertility      BP 108/70 mmHg  Pulse 73  Temp(Src) 98.6 F (37 C) (Oral)  Wt 263 lb 12.8 oz (119.659 kg)  SpO2 97%  Review of Systems Denies edema and ED sxs.    Objective:   Physical Exam VITAL  SIGNS:  See vs page GENERAL: no distress Ext: no edema.     Lab Results  Component Value Date   TESTOSTERONE 316.22 07/04/2014      Assessment & Plan:  Hypogonadism: well-controlled.  Please continue the same medication.

## 2014-07-08 ENCOUNTER — Encounter: Payer: Self-pay | Admitting: Sports Medicine

## 2014-07-08 ENCOUNTER — Other Ambulatory Visit: Payer: Self-pay | Admitting: Sports Medicine

## 2014-07-14 ENCOUNTER — Encounter: Payer: Self-pay | Admitting: Sports Medicine

## 2014-07-14 ENCOUNTER — Ambulatory Visit (INDEPENDENT_AMBULATORY_CARE_PROVIDER_SITE_OTHER): Payer: 59 | Admitting: Sports Medicine

## 2014-07-14 VITALS — BP 116/78 | HR 64 | Ht 75.0 in | Wt 261.0 lb

## 2014-07-14 DIAGNOSIS — E230 Hypopituitarism: Secondary | ICD-10-CM

## 2014-07-14 DIAGNOSIS — E669 Obesity, unspecified: Secondary | ICD-10-CM | POA: Insufficient documentation

## 2014-07-14 MED ORDER — PHENTERMINE HCL 37.5 MG PO TABS: ORAL_TABLET | ORAL | Status: DC

## 2014-07-14 NOTE — Assessment & Plan Note (Signed)
Starting phentermine. Return monthly for weight checks and refills. 

## 2014-07-14 NOTE — Progress Notes (Signed)
  Subjective:    CC: Obesity  HPI: Obesity: Desires surface phentermine.  Hypogonadotrophic hypogonadism: With pituitary insufficiency, currently on Clomid with any chronology, testosterone levels have risen to the mid 300s and he feels significantly better.  Past medical history, Surgical history, Family history not pertinant except as noted below, Social history, Allergies, and medications have been entered into the medical record, reviewed, and no changes needed.   Review of Systems: No fevers, chills, night sweats, weight loss, chest pain, or shortness of breath.   Objective:    General: Well Developed, well nourished, and in no acute distress.  Neuro: Alert and oriented x3, extra-ocular muscles intact, sensation grossly intact.  HEENT: Normocephalic, atraumatic, pupils equal round reactive to light, neck supple, no masses, no lymphadenopathy, thyroid nonpalpable.  Skin: Warm and dry, no rashes. Cardiac: Regular rate and rhythm, no murmurs rubs or gallops, no lower extremity edema.  Respiratory: Clear to auscultation bilaterally. Not using accessory muscles, speaking in full sentences.  Impression and Recommendations:

## 2014-07-14 NOTE — Assessment & Plan Note (Signed)
Doing well with clomiphene pills with endocrinology. Testosterone is in 300s.

## 2014-08-11 ENCOUNTER — Ambulatory Visit (INDEPENDENT_AMBULATORY_CARE_PROVIDER_SITE_OTHER): Payer: 59 | Admitting: Sports Medicine

## 2014-08-11 ENCOUNTER — Encounter: Payer: Self-pay | Admitting: Sports Medicine

## 2014-08-11 VITALS — BP 126/78 | HR 86 | Ht 75.0 in | Wt 240.0 lb

## 2014-08-11 DIAGNOSIS — Z23 Encounter for immunization: Secondary | ICD-10-CM

## 2014-08-11 DIAGNOSIS — E230 Hypopituitarism: Secondary | ICD-10-CM | POA: Diagnosis not present

## 2014-08-11 DIAGNOSIS — Z7189 Other specified counseling: Secondary | ICD-10-CM | POA: Diagnosis not present

## 2014-08-11 DIAGNOSIS — Z7184 Encounter for health counseling related to travel: Secondary | ICD-10-CM | POA: Insufficient documentation

## 2014-08-11 DIAGNOSIS — Z0183 Encounter for blood typing: Secondary | ICD-10-CM

## 2014-08-11 DIAGNOSIS — E669 Obesity, unspecified: Secondary | ICD-10-CM | POA: Diagnosis not present

## 2014-08-11 LAB — COMPREHENSIVE METABOLIC PANEL
ALT: 58 U/L — ABNORMAL HIGH (ref 0–53)
AST: 61 U/L — ABNORMAL HIGH (ref 0–37)
Albumin: 4.4 g/dL (ref 3.5–5.2)
Alkaline Phosphatase: 31 U/L — ABNORMAL LOW (ref 39–117)
BUN: 8 mg/dL (ref 6–23)
CO2: 24 mEq/L (ref 19–32)
Creat: 0.91 mg/dL (ref 0.50–1.35)
Sodium: 136 mEq/L (ref 135–145)
Total Bilirubin: 0.6 mg/dL (ref 0.2–1.2)

## 2014-08-11 LAB — COMPREHENSIVE METABOLIC PANEL WITH GFR
Calcium: 8.9 mg/dL (ref 8.4–10.5)
Chloride: 99 meq/L (ref 96–112)
Glucose, Bld: 95 mg/dL (ref 70–99)
Potassium: 4.2 meq/L (ref 3.5–5.3)
Total Protein: 6.4 g/dL (ref 6.0–8.3)

## 2014-08-11 MED ORDER — CIPROFLOXACIN HCL 500 MG PO TABS: 500.0000 mg | ORAL_TABLET | Freq: Two times a day (BID) | ORAL | Status: DC

## 2014-08-11 MED ORDER — PHENTERMINE HCL 37.5 MG PO TABS: ORAL_TABLET | ORAL | Status: DC

## 2014-08-11 NOTE — Assessment & Plan Note (Signed)
I have recommended all the routine vaccinations, patient does continue to be somewhat anti-vaccine. Prescription for Cipro in case of traveler's diarrhea.

## 2014-08-11 NOTE — Assessment & Plan Note (Signed)
Currently on Clomid. Rechecking testosterone levels.

## 2014-08-11 NOTE — Progress Notes (Signed)
  Subjective:    CC: Follow-up  HPI: Obesity: 21 pound weight loss after the first month of medication.  Hypogonadotrophic hypogonadism: Doing well with Clomid, most recent testosterone levels were up to 300 from 42. Amenable to recheck this.  Travel: Going to [place] staying on [place] resort.  Past medical history, Surgical history, Family history not pertinant except as noted below, Social history, Allergies, and medications have been entered into the medical record, reviewed, and no changes needed.   Review of Systems: No fevers, chills, night sweats, weight loss, chest pain, or shortness of breath.   Objective:    General: Well Developed, well nourished, and in no acute distress.  Neuro: Alert and oriented x3, extra-ocular muscles intact, sensation grossly intact.  HEENT: Normocephalic, atraumatic, pupils equal round reactive to light, neck supple, no masses, no lymphadenopathy, thyroid nonpalpable.  Skin: Warm and dry, no rashes. Cardiac: Regular rate and rhythm, no murmurs rubs or gallops, no lower extremity edema.  Respiratory: Clear to auscultation bilaterally. Not using accessory muscles, speaking in full sentences.  Impression and Recommendations:    I spent 40 minutes with this patient, greater than 50% was face-to-face time counseling regarding the above several diagnoses.

## 2014-08-11 NOTE — Assessment & Plan Note (Signed)
1 pound weight loss after the first month. Continue appropriate dieting, and exercise. Goal weight is 210 pounds. Refilling phentermine as we enter the second month.

## 2014-08-12 LAB — TESTOSTERONE, FREE, TOTAL, SHBG
Sex Hormone Binding: 50 nmol/L (ref 10–50)
Testosterone, Free: 87.9 pg/mL (ref 47.0–244.0)
Testosterone-% Free: 1.6 % (ref 1.6–2.9)
Testosterone: 543 ng/dL (ref 300–890)

## 2014-08-12 LAB — HEPATITIS B SURFACE ANTIBODY, QUANTITATIVE: Hep B S AB Quant (Post): 128 m[IU]/mL

## 2014-08-12 LAB — HEPATITIS A ANTIBODY, TOTAL: Hep A Total Ab: NONREACTIVE

## 2014-09-02 ENCOUNTER — Encounter: Payer: Self-pay | Admitting: Sports Medicine

## 2014-09-02 ENCOUNTER — Ambulatory Visit (INDEPENDENT_AMBULATORY_CARE_PROVIDER_SITE_OTHER): Payer: 59 | Admitting: Sports Medicine

## 2014-09-02 VITALS — BP 131/74 | HR 72 | Ht 75.0 in | Wt 227.0 lb

## 2014-09-02 DIAGNOSIS — Z0000 Encounter for general adult medical examination without abnormal findings: Secondary | ICD-10-CM | POA: Diagnosis not present

## 2014-09-02 DIAGNOSIS — E669 Obesity, unspecified: Secondary | ICD-10-CM | POA: Diagnosis not present

## 2014-09-02 DIAGNOSIS — Z23 Encounter for immunization: Secondary | ICD-10-CM | POA: Diagnosis not present

## 2014-09-02 MED ORDER — PHENTERMINE HCL 37.5 MG PO TABS: ORAL_TABLET | ORAL | Status: DC

## 2014-09-02 NOTE — Assessment & Plan Note (Signed)
Good continued weight loss as we enter the third month. He has dropped 33 pounds. Goal weight is 210.

## 2014-09-02 NOTE — Assessment & Plan Note (Signed)
Hepatitis A vaccine today, and then in 6-12 months.

## 2014-09-02 NOTE — Progress Notes (Signed)
  Subjective:    CC: Weight check  HPI: Gabriel RungJoe returns, he continues to lose weight after 2 months on phentermine. He has been doing a good amount of resistance training as well as core workouts and cardio. He is happy with the results so far, and is eager to continue the medication. He does do a pre-workout mix with some degree of caffeine, he takes his phentermine in the morning, and the pre-workout mixes in the evening, and he does not any palpitations or jitteriness.  He does have [place], he was found not to be immune to hepatitis A, and he had no vaccine documentation, hepatitis B immunity was good. He will get his first hepatitis A vaccine today, and his next one in 6-12 months.  Past medical history, Surgical history, Family history not pertinant except as noted below, Social history, Allergies, and medications have been entered into the medical record, reviewed, and no changes needed.   Review of Systems: No fevers, chills, night sweats, weight loss, chest pain, or shortness of breath.   Objective:    General: Well Developed, well nourished, and in no acute distress.  Neuro: Alert and oriented x3, extra-ocular muscles intact, sensation grossly intact.  HEENT: Normocephalic, atraumatic, pupils equal round reactive to light, neck supple, no masses, no lymphadenopathy, thyroid nonpalpable.  Skin: Warm and dry, no rashes. Cardiac: Regular rate and rhythm, no murmurs rubs or gallops, no lower extremity edema.  Respiratory: Clear to auscultation bilaterally. Not using accessory muscles, speaking in full sentences.  Impression and Recommendations:

## 2014-09-30 ENCOUNTER — Ambulatory Visit (INDEPENDENT_AMBULATORY_CARE_PROVIDER_SITE_OTHER): Payer: 59 | Admitting: Sports Medicine

## 2014-09-30 ENCOUNTER — Encounter: Payer: Self-pay | Admitting: Endocrinology

## 2014-09-30 ENCOUNTER — Encounter: Payer: Self-pay | Admitting: Sports Medicine

## 2014-09-30 VITALS — BP 111/62 | HR 74 | Ht 75.0 in | Wt 224.0 lb

## 2014-09-30 DIAGNOSIS — E669 Obesity, unspecified: Secondary | ICD-10-CM

## 2014-09-30 DIAGNOSIS — E230 Hypopituitarism: Secondary | ICD-10-CM

## 2014-09-30 MED ORDER — PHENTERMINE HCL 37.5 MG PO TABS: ORAL_TABLET | ORAL | Status: DC

## 2014-09-30 NOTE — Progress Notes (Signed)
  Subjective:    CC: Weight check  HPI: Obesity: Good continued weight loss, 40 pounds so far as we enter the fourth month.  Past medical history, Surgical history, Family history not pertinant except as noted below, Social history, Allergies, and medications have been entered into the medical record, reviewed, and no changes needed.   Review of Systems: No fevers, chills, night sweats, weight loss, chest pain, or shortness of breath.   Objective:    General: Well Developed, well nourished, and in no acute distress.  Neuro: Alert and oriented x3, extra-ocular muscles intact, sensation grossly intact.  HEENT: Normocephalic, atraumatic, pupils equal round reactive to light, neck supple, no masses, no lymphadenopathy, thyroid nonpalpable.  Skin: Warm and dry, no rashes. Cardiac: Regular rate and rhythm, no murmurs rubs or gallops, no lower extremity edema.  Respiratory: Clear to auscultation bilaterally. Not using accessory muscles, speaking in full sentences.  Impression and Recommendations:

## 2014-09-30 NOTE — Assessment & Plan Note (Signed)
Good continued weight loss as we entered the fourth month. He's lost about 40 pounds so far. Goal weight is 210 pounds. Certainly we could consider increasing his clomiphene to boost testosterone levels from 500-6 or 700. I will see him back in one month.

## 2014-10-28 ENCOUNTER — Encounter: Payer: Self-pay | Admitting: Sports Medicine

## 2014-10-28 ENCOUNTER — Ambulatory Visit (INDEPENDENT_AMBULATORY_CARE_PROVIDER_SITE_OTHER): Payer: 59 | Admitting: Sports Medicine

## 2014-10-28 VITALS — BP 124/77 | HR 82 | Ht 75.0 in | Wt 221.0 lb

## 2014-10-28 DIAGNOSIS — E230 Hypopituitarism: Secondary | ICD-10-CM

## 2014-10-28 DIAGNOSIS — E669 Obesity, unspecified: Secondary | ICD-10-CM

## 2014-10-28 LAB — CBC
HCT: 48.3 % (ref 39.0–52.0)
Hemoglobin: 16.4 g/dL (ref 13.0–17.0)
MCH: 30.5 pg (ref 26.0–34.0)
MCHC: 34 g/dL (ref 30.0–36.0)
MCV: 89.8 fL (ref 78.0–100.0)
MPV: 8.9 fL (ref 8.6–12.4)
Platelets: 195 K/uL (ref 150–400)
RBC: 5.38 MIL/uL (ref 4.22–5.81)
RDW: 12.9 % (ref 11.5–15.5)
WBC: 5.4 K/uL (ref 4.0–10.5)

## 2014-10-28 MED ORDER — PHENTERMINE HCL 37.5 MG PO TABS: ORAL_TABLET | ORAL | Status: DC

## 2014-10-28 NOTE — Assessment & Plan Note (Signed)
Currently now on one half tab (25 mg) of Clomid. Previously on one quarter tab he was at 500. patient ideally wanted ideally 700. We are going to recheck his testosterone levels today.

## 2014-10-28 NOTE — Progress Notes (Signed)
  Subjective:    CC: Follow-up  HPI: Obesity: Good additional weight loss has been to the fifth month.  Hypogonadotrophic hypergonadism: We increased his Clomid from one quarter tablet to one half tablet at the last visit, he is amenable to recheck his testosterone levels today.  Past medical history, Surgical history, Family history not pertinant except as noted below, Social history, Allergies, and medications have been entered into the medical record, reviewed, and no changes needed.   Review of Systems: No fevers, chills, night sweats, weight loss, chest pain, or shortness of breath.   Objective:    General: Well Developed, well nourished, and in no acute distress.  Neuro: Alert and oriented x3, extra-ocular muscles intact, sensation grossly intact.  HEENT: Normocephalic, atraumatic, pupils equal round reactive to light, neck supple, no masses, no lymphadenopathy, thyroid nonpalpable.  Skin: Warm and dry, no rashes. Cardiac: Regular rate and rhythm, no murmurs rubs or gallops, no lower extremity edema.  Respiratory: Clear to auscultation bilaterally. Not using accessory muscles, speaking in full sentences.  Impression and Recommendations:    I spent 25 minutes with this patient, 50% was face-to-face time calcium regarding the above diagnoses as well as discussion of appropriate supplements for bodybuilding.

## 2014-10-28 NOTE — Assessment & Plan Note (Signed)
Excellent continued weight loss, almost 50 pounds now as we enter the month. Refilling occasional, we did increase his clomiphene an effort to bring his testosterone levels above 500. We are going to check this as well. Next line return in one month for a weight check.

## 2014-10-29 LAB — PSA, TOTAL AND FREE
PSA, Free Pct: 44 % (ref >25)
PSA, Free: 0.23 ng/mL
PSA: 0.52 ng/mL (ref <=4.00)

## 2014-11-01 LAB — TESTOSTERONE, FREE, TOTAL, SHBG
Sex Hormone Binding: 39 nmol/L (ref 10–50)
Testosterone, Free: 162.3 pg/mL (ref 47.0–244.0)
Testosterone-% Free: 2.1 % (ref 1.6–2.9)
Testosterone: 786 ng/dL (ref 300–890)

## 2014-11-18 ENCOUNTER — Encounter: Payer: Self-pay | Admitting: Sports Medicine

## 2014-11-18 MED ORDER — CLOMIPHENE CITRATE 50 MG PO TABS: 25.0000 mg | ORAL_TABLET | Freq: Every day | ORAL | Status: DC

## 2014-11-25 ENCOUNTER — Ambulatory Visit (INDEPENDENT_AMBULATORY_CARE_PROVIDER_SITE_OTHER): Payer: 59 | Admitting: Sports Medicine

## 2014-11-25 ENCOUNTER — Encounter: Payer: Self-pay | Admitting: Sports Medicine

## 2014-11-25 VITALS — BP 120/79 | HR 66 | Ht 74.0 in | Wt 219.0 lb

## 2014-11-25 DIAGNOSIS — E669 Obesity, unspecified: Secondary | ICD-10-CM

## 2014-11-25 DIAGNOSIS — E230 Hypopituitarism: Secondary | ICD-10-CM

## 2014-11-25 MED ORDER — PHENTERMINE HCL 37.5 MG PO TABS: ORAL_TABLET | ORAL | Status: DC

## 2014-11-25 NOTE — Assessment & Plan Note (Signed)
Testosterone levels are extremely well controlled, we will continue this current dose of Clomid, and check testosterone, CBC, PSA, metabolic panel every 6 months.

## 2014-11-25 NOTE — Progress Notes (Signed)
  Subjective:    CC: Follow-up  HPI: Obesity: Doing extremely well, he is finished 6 months of phentermine and some lost 50 pounds.  Hypogonadotrophic hypergonadism: Testosterone started at 41, now with a half tablet of clomiphene he is at 700, feels very well, able to put on muscle, happy with how things are going.  Past medical history, Surgical history, Family history not pertinant except as noted below, Social history, Allergies, and medications have been entered into the medical record, reviewed, and no changes needed.   Review of Systems: No fevers, chills, night sweats, weight loss, chest pain, or shortness of breath.   Objective:    General: Well Developed, well nourished, and in no acute distress.  Neuro: Alert and oriented x3, extra-ocular muscles intact, sensation grossly intact.  HEENT: Normocephalic, atraumatic, pupils equal round reactive to light, neck supple, no masses, no lymphadenopathy, thyroid nonpalpable.  Skin: Warm and dry, no rashes. Cardiac: Regular rate and rhythm, no murmurs rubs or gallops, no lower extremity edema.  Respiratory: Clear to auscultation bilaterally. Not using accessory muscles, speaking in full sentences.  Impression and Recommendations:    I spent 25 minutes with this patient, greater than 50% was face-to-face time counseling regarding the above diagnoses

## 2014-11-25 NOTE — Assessment & Plan Note (Signed)
Some weight gain but we have increased his testosterone and he is been getting muscle. Refilling phentermine, one half tab daily, 3 months given. Return in 3 months.

## 2015-02-23 ENCOUNTER — Ambulatory Visit (INDEPENDENT_AMBULATORY_CARE_PROVIDER_SITE_OTHER): Payer: 59 | Admitting: Sports Medicine

## 2015-02-23 ENCOUNTER — Encounter: Payer: Self-pay | Admitting: Sports Medicine

## 2015-02-23 VITALS — BP 126/72 | HR 75 | Ht 75.0 in | Wt 225.0 lb

## 2015-02-23 DIAGNOSIS — E230 Hypopituitarism: Secondary | ICD-10-CM

## 2015-02-23 DIAGNOSIS — E669 Obesity, unspecified: Secondary | ICD-10-CM

## 2015-02-23 MED ORDER — CLOMIPHENE CITRATE 50 MG PO TABS: 25.0000 mg | ORAL_TABLET | Freq: Every day | ORAL | Status: DC

## 2015-02-23 MED ORDER — PHENTERMINE HCL 37.5 MG PO TABS: ORAL_TABLET | ORAL | Status: DC

## 2015-02-23 NOTE — Progress Notes (Signed)
  Subjective:    CC: Follow-up  HPI: Hypogonadotrophic hypogonadism: Doing extremely well with Clomid  Obesity: Has lost significant weight, 50 pounds, we switched to a half tab phentermine 3 months ago and he ha only put on 6 pounds  Past medical history, Surgical history, Family history not pertinant except as noted below, Social history, Allergies, and medications have been entered into the medical record, reviewed, and no changes needed.   Review of Systems: No fevers, chills, night sweats, weight loss, chest pain, or shortness of breath.   Objective:    General: Well Developed, well nourished, and in no acute distress.  Neuro: Alert and oriented x3, extra-ocular muscles intact, sensation grossly intact.  HEENT: Normocephalic, atraumatic, pupils equal round reactive to light, neck supple, no masses, no lymphadenopathy, thyroid nonpalpable.  Skin: Warm and dry, no rashes. Cardiac: Regular rate and rhythm, no murmurs rubs or gallops, no lower extremity edema.  Respiratory: Clear to auscultation bilaterally. Not using accessory muscles, speaking in full sentences.  Impression and Recommendations:

## 2015-02-23 NOTE — Assessment & Plan Note (Signed)
Doing extremely well on Clomid half a tab daily, testosterone levels were 700. Refilling medication.

## 2015-02-23 NOTE — Assessment & Plan Note (Signed)
50 pound weight loss, we have finished 6 months of full dose in 3 months of half dose, he did put on a few pounds, refilling phentermine at half dose for 3 more months and then we will fully discontinue. Jomarie LongsJoseph agrees to get back into the gym.

## 2015-03-01 ENCOUNTER — Telehealth: Payer: Self-pay | Admitting: Sports Medicine

## 2015-03-01 NOTE — Telephone Encounter (Signed)
Received fax for prior authorization on Phentermine sent through cover my meds waiting on authorization. - CF °

## 2015-03-09 NOTE — Telephone Encounter (Signed)
Received a denial from [facility] for Phentermine.  Will give to PCP for review.

## 2015-03-21 ENCOUNTER — Encounter: Payer: Self-pay | Admitting: Sports Medicine

## 2015-03-22 MED ORDER — CLOMIPHENE CITRATE 50 MG PO TABS: 25.0000 mg | ORAL_TABLET | Freq: Every day | ORAL | Status: DC

## 2015-05-26 ENCOUNTER — Encounter: Payer: Self-pay | Admitting: Sports Medicine

## 2015-05-26 ENCOUNTER — Ambulatory Visit (INDEPENDENT_AMBULATORY_CARE_PROVIDER_SITE_OTHER): Payer: 59 | Admitting: Sports Medicine

## 2015-05-26 VITALS — BP 127/80 | HR 80 | Resp 18 | Wt 227.0 lb

## 2015-05-26 DIAGNOSIS — E669 Obesity, unspecified: Secondary | ICD-10-CM

## 2015-05-26 DIAGNOSIS — E230 Hypopituitarism: Secondary | ICD-10-CM | POA: Diagnosis not present

## 2015-05-26 MED ORDER — CLOMIPHENE CITRATE 50 MG PO TABS: 25.0000 mg | ORAL_TABLET | Freq: Every day | ORAL | Status: DC

## 2015-05-26 NOTE — Assessment & Plan Note (Signed)
Overall approximately 50 pound weight loss since we started phentermine, he has been off the phentermine for sometime now and has done a good job keeping his weight off. Certainly in the future we can consider Saxenda however at this point we will leave alone. He will keep the weight off himself.

## 2015-05-26 NOTE — Assessment & Plan Note (Signed)
Continues to do extremely well on one half tab of Clomid daily, testosterone levels were 700, refilling medication, return in 6 months.

## 2015-05-26 NOTE — Progress Notes (Signed)
  Subjective:    CC: Follow-up  HPI: Obesity: Kenrick is done a fantastic job, he's lost 50 pounds and is The weight off even being off of phentermine.  Hypogonadotrophic hypergonadism: Doing well on Clomid one half tab daily, needs a refill.  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  Past medical history, Surgical history, Family history not pertinant except as noted below, Social history, Allergies, and medications have been entered into the medical record, reviewed, and no changes needed.   Review of Systems: No fevers, chills, night sweats, weight loss, chest pain, or shortness of breath.   Objective:    General: Well Developed, well nourished, and in no acute distress.  Neuro: Alert and oriented x3, extra-ocular muscles intact, sensation grossly intact.  HEENT: Normocephalic, atraumatic, pupils equal round reactive to light, neck supple, no masses, no lymphadenopathy, thyroid nonpalpable.  Skin: Warm and dry, no rashes. Cardiac: Regular rate and rhythm, no murmurs rubs or gallops, no lower extremity edema.  Respiratory: Clear to auscultation bilaterally. Not using accessory muscles, speaking in full sentences.  Impression and Recommendations:

## 2015-06-10 ENCOUNTER — Encounter: Payer: Self-pay | Admitting: Sports Medicine

## 2015-08-28 MED FILL — CLOMIPHENE CITRATE 50 MG TA: 50 | 90 days supply | Qty: 45 | Fill #1

## 2015-11-23 ENCOUNTER — Ambulatory Visit (INDEPENDENT_AMBULATORY_CARE_PROVIDER_SITE_OTHER): Payer: 59 | Admitting: Sports Medicine

## 2015-11-23 ENCOUNTER — Encounter: Payer: Self-pay | Admitting: Sports Medicine

## 2015-11-23 DIAGNOSIS — Z0000 Encounter for general adult medical examination without abnormal findings: Secondary | ICD-10-CM

## 2015-11-23 DIAGNOSIS — E230 Hypopituitarism: Secondary | ICD-10-CM

## 2015-11-23 DIAGNOSIS — R41840 Attention and concentration deficit: Secondary | ICD-10-CM | POA: Diagnosis not present

## 2015-11-23 NOTE — Assessment & Plan Note (Signed)
Doing well, checking testosterone.

## 2015-11-23 NOTE — Assessment & Plan Note (Signed)
Some symptoms of depression and anxiety, as well as a lifelong history of poor concentration. I'm going to pull the trigger for neuropsychologic testing to aid with diagnosis. 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

## 2015-11-23 NOTE — Progress Notes (Signed)
  Subjective:    CC:  CPE  HPI:  This is a pleasant 35 year old male here for his physical, no complaint the exception of poor concentration. He's noted decreased concentration and decreased goal-directed activity with some scatterbrained activity since childhood. Some anxiety and depressive symptoms as well, no suicidal or homicidal ideation.  Hypogonadotrophic hypogonadism: Stable on Clomid.  Past medical history, Surgical history, Family history not pertinant except as noted below, Social history, Allergies, and medications have been entered into the medical record, reviewed, and no changes needed.   Review of Systems: No headache, visual changes, nausea, vomiting, diarrhea, constipation, dizziness, abdominal pain, skin rash, fevers, chills, night sweats, swollen lymph nodes, weight loss, chest pain, body aches, joint swelling, muscle aches, shortness of breath, mood changes, visual or auditory hallucinations.  Objective:    General: Well Developed, well nourished, and in no acute distress.  Neuro: Alert and oriented x3, extra-ocular muscles intact, sensation grossly intact. Cranial nerves II through XII are intact, motor, sensory, and coordinative functions are all intact. HEENT: Normocephalic, atraumatic, pupils equal round reactive to light, neck supple, no masses, no lymphadenopathy, thyroid nonpalpable. Oropharynx, nasopharynx, external ear canals are unremarkable. Skin: Warm and dry, no rashes noted.  Cardiac: Regular rate and rhythm, no murmurs rubs or gallops.  Respiratory: Clear to auscultation bilaterally. Not using accessory muscles, speaking in full sentences.  Abdominal: Soft, nontender, nondistended, positive bowel sounds, no masses, no organomegaly.  Musculoskeletal: Shoulder, elbow, wrist, hip, knee, ankle stable, and with full range of motion.  Impression and Recommendations:    The patient was counselled, risk factors were discussed, anticipatory guidance given.

## 2015-11-23 NOTE — Assessment & Plan Note (Signed)
Physical exam as above, checking routine blood work.

## 2015-11-24 LAB — COMPREHENSIVE METABOLIC PANEL WITH GFR
AST: 18 U/L (ref 10–40)
Alkaline Phosphatase: 37 U/L — ABNORMAL LOW (ref 40–115)
BUN: 14 mg/dL (ref 7–25)
CO2: 26 mmol/L (ref 20–31)
Glucose, Bld: 85 mg/dL (ref 65–99)
Potassium: 4.6 mmol/L (ref 3.5–5.3)

## 2015-11-24 LAB — CBC
HCT: 49.5 % (ref 38.5–50.0)
Hemoglobin: 17 g/dL (ref 13.2–17.1)
MCH: 31.5 pg (ref 27.0–33.0)
MCHC: 34.3 g/dL (ref 32.0–36.0)
MCV: 91.7 fL (ref 80.0–100.0)
MPV: 9.1 fL (ref 7.5–12.5)
Platelets: 201 10*3/uL (ref 140–400)
RBC: 5.4 MIL/uL (ref 4.20–5.80)
RDW: 12.8 % (ref 11.0–15.0)
WBC: 4.9 K/uL (ref 3.8–10.8)

## 2015-11-24 LAB — LIPID PANEL
Cholesterol: 160 mg/dL (ref 125–200)
HDL: 50 mg/dL (ref >=40)
LDL Cholesterol: 95 mg/dL (ref <130)
Total CHOL/HDL Ratio: 3.2 Ratio (ref <=5.0)
Triglycerides: 73 mg/dL (ref <150)
VLDL: 15 mg/dL (ref <30)

## 2015-11-24 LAB — HEMOGLOBIN A1C
Hgb A1c MFr Bld: 5.2 % (ref <5.7)
Mean Plasma Glucose: 103 mg/dL

## 2015-11-24 LAB — COMPREHENSIVE METABOLIC PANEL
ALT: 19 U/L (ref 9–46)
Albumin: 4.6 g/dL (ref 3.6–5.1)
Calcium: 9.3 mg/dL (ref 8.6–10.3)
Chloride: 102 mmol/L (ref 98–110)
Creat: 0.95 mg/dL (ref 0.60–1.35)
Sodium: 139 mmol/L (ref 135–146)
Total Bilirubin: 0.5 mg/dL (ref 0.2–1.2)
Total Protein: 6.6 g/dL (ref 6.1–8.1)

## 2015-11-24 LAB — TSH: TSH: 0.96 m[IU]/L (ref 0.40–4.50)

## 2015-11-25 LAB — ABO AND RH: Rh Type: POSITIVE

## 2015-11-25 LAB — VITAMIN D 25 HYDROXY (VIT D DEFICIENCY, FRACTURES): Vit D, 25-Hydroxy: 42 ng/mL (ref 30–100)

## 2015-11-25 LAB — TESTOSTERONE: Testosterone: 843 ng/dL — ABNORMAL HIGH (ref 250–827)

## 2015-11-27 MED FILL — CLOMIPHENE CITRATE 50 MG TA: 50 | 90 days supply | Qty: 45 | Fill #0

## 2015-11-30 ENCOUNTER — Encounter: Payer: Self-pay | Admitting: Sports Medicine

## 2015-11-30 NOTE — Telephone Encounter (Signed)
Please look into this

## 2016-01-12 ENCOUNTER — Ambulatory Visit: Payer: 59 | Admitting: Psychology

## 2016-01-12 ENCOUNTER — Ambulatory Visit (INDEPENDENT_AMBULATORY_CARE_PROVIDER_SITE_OTHER): Payer: 59 | Admitting: Psychology

## 2016-01-12 DIAGNOSIS — F909 Attention-deficit hyperactivity disorder, unspecified type: Secondary | ICD-10-CM

## 2016-01-12 DIAGNOSIS — F908 Attention-deficit hyperactivity disorder, other type: Secondary | ICD-10-CM | POA: Diagnosis not present

## 2016-02-03 IMAGING — CR DG KNEE 1-2V*R*
2 series · 2 of 2 positions shown · non-contrast
Comparison: None.

CLINICAL DATA: 33-year-old male with pain above the patella and
swelling with limited range of motion. Initial encounter.

EXAM:
RIGHT KNEE - 1-2 VIEW

[view not recorded (1 of 2)]
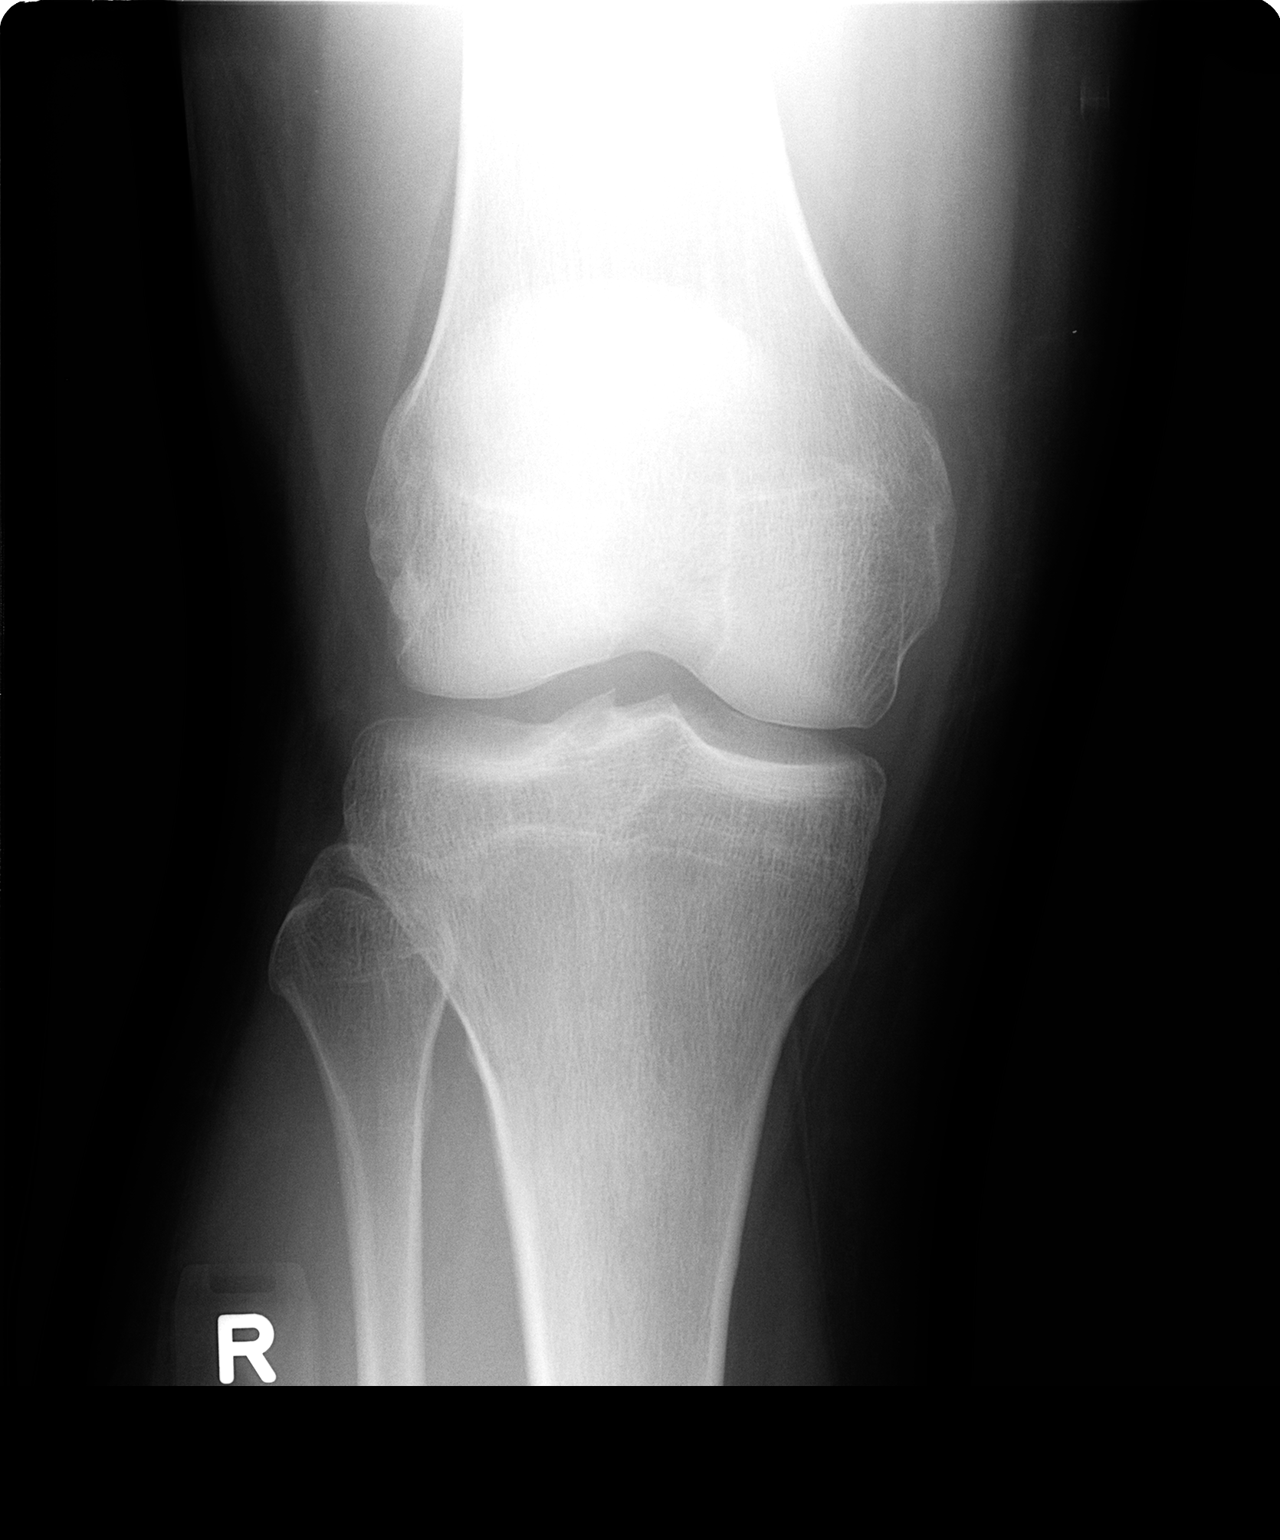

[view not recorded (2 of 2)]
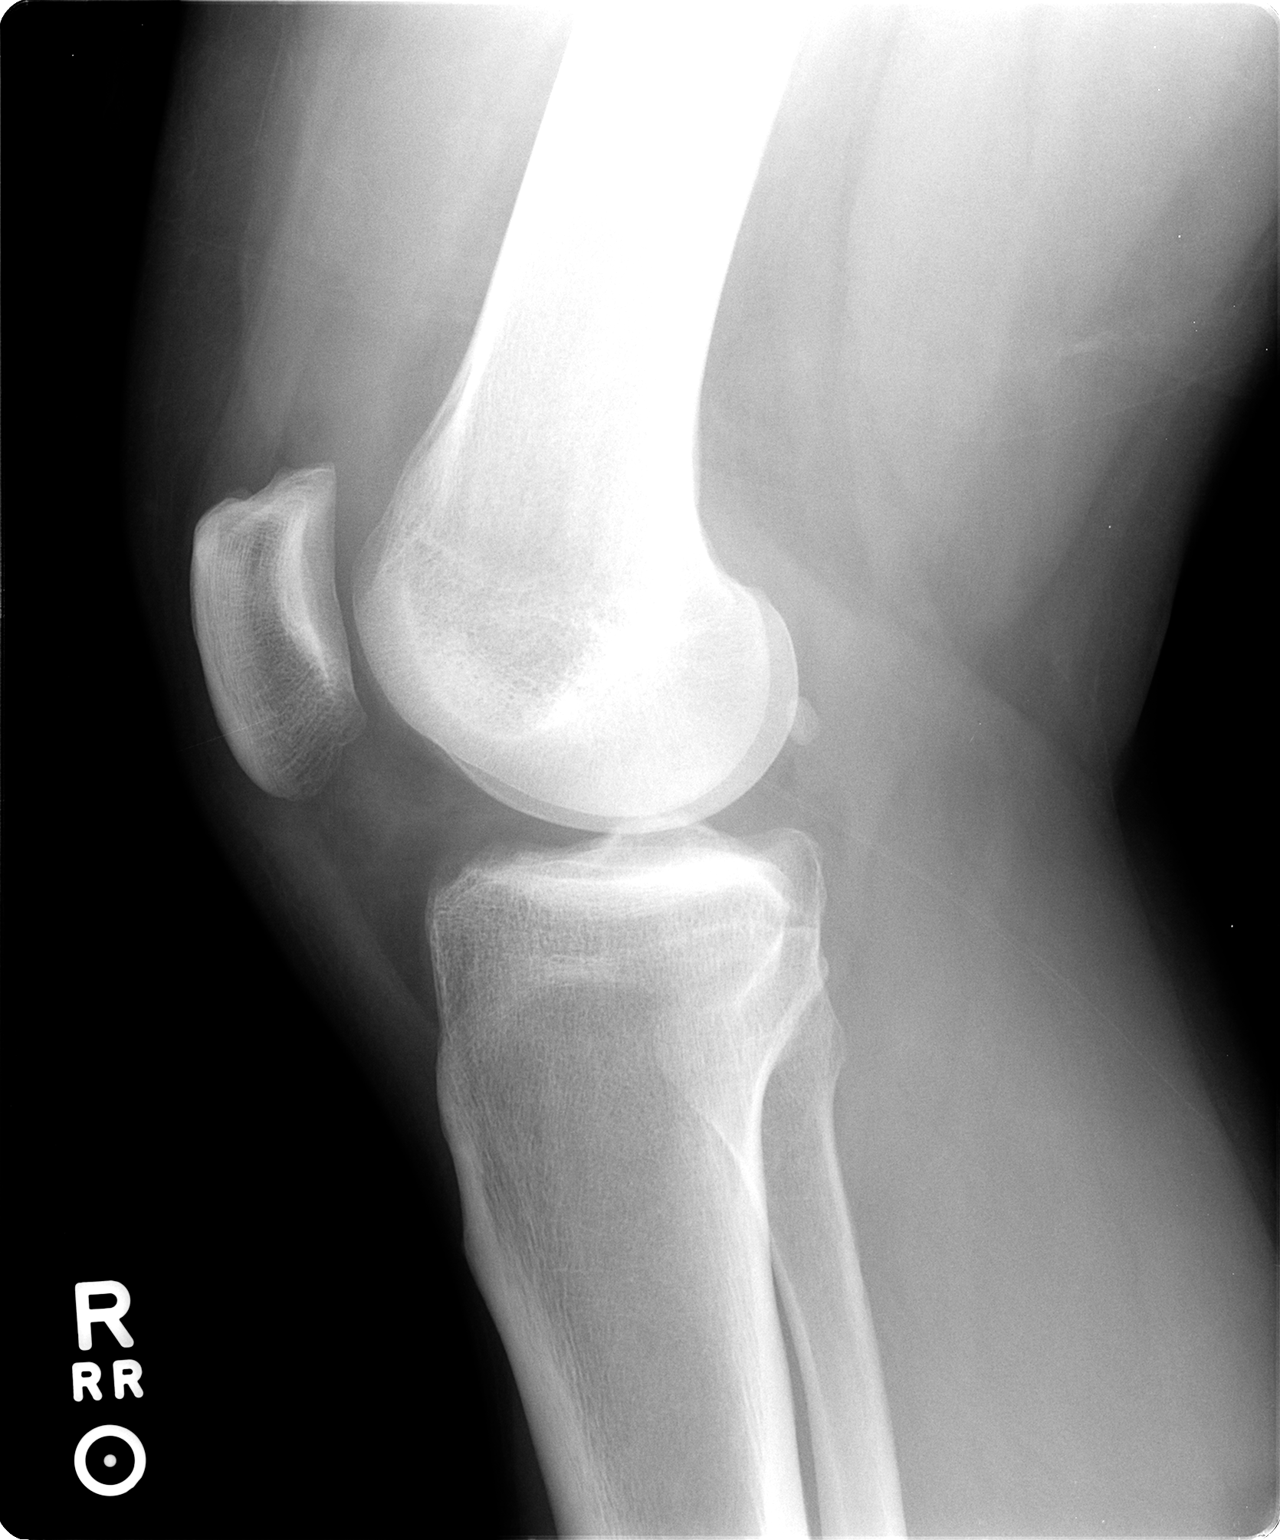

[2 of 2 positions shown; findings below may reference images not displayed]

FINDINGS: Small to moderate joint effusion. Patella appears intact. Bone
mineralization is within normal limits. Joint spaces appear
preserved. No acute fracture or dislocation identified.
IMPRESSION: Moderate joint effusion with no acute osseous abnormality identified
at the right knee.

## 2016-03-11 MED FILL — CLOMIPHENE CITRATE 50 MG TA: 50 | 90 days supply | Qty: 45 | Fill #1

## 2016-06-21 ENCOUNTER — Other Ambulatory Visit: Payer: Self-pay | Admitting: Sports Medicine

## 2016-06-21 MED FILL — CLOMIPHENE CITRATE 50 MG TA: 50 | 90 days supply | Qty: 45 | Fill #0

## 2016-07-12 ENCOUNTER — Ambulatory Visit (INDEPENDENT_AMBULATORY_CARE_PROVIDER_SITE_OTHER): Payer: 59 | Admitting: Sports Medicine

## 2016-07-12 DIAGNOSIS — Z6830 Body mass index (BMI) 30.0-30.9, adult: Secondary | ICD-10-CM | POA: Diagnosis not present

## 2016-07-12 DIAGNOSIS — E6609 Other obesity due to excess calories: Secondary | ICD-10-CM

## 2016-07-12 DIAGNOSIS — E66811 Obesity, class 1: Secondary | ICD-10-CM

## 2016-07-12 MED ORDER — PHENTERMINE HCL 37.5 MG PO TABS: ORAL_TABLET | ORAL | 0 refills | Status: DC

## 2016-07-12 MED FILL — PHENTERMINE 37.5 MG TABLET: 37.5 | 30 days supply | Qty: 30 | Fill #0

## 2016-07-12 NOTE — Progress Notes (Signed)
  Subjective:    CC: Obesity  HPI: Jeffery LongsJoseph returns, he had fantastic weight loss initially with phentermine a couple of years ago, desires to restart.  Past medical history:  Negative.  See flowsheet/record as well for more information.  Surgical history: Negative.  See flowsheet/record as well for more information.  Family history: Negative.  See flowsheet/record as well for more information.  Social history: Negative.  See flowsheet/record as well for more information.  Allergies, and medications have been entered into the medical record, reviewed, and no changes needed.   Review of Systems: No fevers, chills, night sweats, weight loss, chest pain, or shortness of breath.   Objective:    General: Well Developed, well nourished, and in no acute distress.  Neuro: Alert and oriented x3, extra-ocular muscles intact, sensation grossly intact.  HEENT: Normocephalic, atraumatic, pupils equal round reactive to light, neck supple, no masses, no lymphadenopathy, thyroid nonpalpable.  Skin: Warm and dry, no rashes. Cardiac: Regular rate and rhythm, no murmurs rubs or gallops, no lower extremity edema.  Respiratory: Clear to auscultation bilaterally. Not using accessory muscles, speaking in full sentences.  Impression and Recommendations:    Obesity Restarting weight loss medication, starting phentermine max dose, return monthly

## 2016-07-12 NOTE — Assessment & Plan Note (Signed)
Restarting weight loss medication, starting phentermine max dose, return monthly

## 2016-08-09 ENCOUNTER — Encounter: Payer: Self-pay | Admitting: Sports Medicine

## 2016-08-09 ENCOUNTER — Ambulatory Visit (INDEPENDENT_AMBULATORY_CARE_PROVIDER_SITE_OTHER): Payer: 59 | Admitting: Sports Medicine

## 2016-08-09 DIAGNOSIS — E6609 Other obesity due to excess calories: Secondary | ICD-10-CM

## 2016-08-09 DIAGNOSIS — E66811 Obesity, class 1: Secondary | ICD-10-CM

## 2016-08-09 DIAGNOSIS — Z6830 Body mass index (BMI) 30.0-30.9, adult: Secondary | ICD-10-CM | POA: Diagnosis not present

## 2016-08-09 MED ORDER — PHENTERMINE HCL 37.5 MG PO TABS: ORAL_TABLET | ORAL | 0 refills | Status: DC

## 2016-08-09 MED FILL — PHENTERMINE 37.5 MG TABLET: 37.5 | 30 days supply | Qty: 30 | Fill #0

## 2016-08-09 NOTE — Progress Notes (Signed)
  Subjective:    CC: Follow-up  HPI: Obesity: Good weight loss after the first month on phentermine.  Past medical history:  Negative.  See flowsheet/record as well for more information.  Surgical history: Negative.  See flowsheet/record as well for more information.  Family history: Negative.  See flowsheet/record as well for more information.  Social history: Negative.  See flowsheet/record as well for more information.  Allergies, and medications have been entered into the medical record, reviewed, and no changes needed.   Review of Systems: No fevers, chills, night sweats, weight loss, chest pain, or shortness of breath.   Objective:    General: Well Developed, well nourished, and in no acute distress.  Neuro: Alert and oriented x3, extra-ocular muscles intact, sensation grossly intact.  HEENT: Normocephalic, atraumatic, pupils equal round reactive to light, neck supple, no masses, no lymphadenopathy, thyroid nonpalpable.  Skin: Warm and dry, no rashes. Cardiac: Regular rate and rhythm, no murmurs rubs or gallops, no lower extremity edema.  Respiratory: Clear to auscultation bilaterally. Not using accessory muscles, speaking in full sentences.  Impression and Recommendations:    Obesity 22 pound weight loss after the first month on phentermine. Refilling medication, he is looking into liposuction which I think is appropriate. I have asked him to research plastic surgeons in the area and I'm happy to a referral when he finds somebody. We would likely stop phentermine for 2 weeks after surgery for recovery. Return in one month for a weight check and refills.

## 2016-08-09 NOTE — Assessment & Plan Note (Signed)
22 pound weight loss after the first month on phentermine. Refilling medication, he is looking into liposuction which I think is appropriate. I have asked him to research plastic surgeons in the area and I'm happy to a referral when he finds somebody. We would likely stop phentermine for 2 weeks after surgery for recovery. Return in one month for a weight check and refills.

## 2016-09-06 ENCOUNTER — Ambulatory Visit (INDEPENDENT_AMBULATORY_CARE_PROVIDER_SITE_OTHER): Payer: 59 | Admitting: Sports Medicine

## 2016-09-06 ENCOUNTER — Encounter: Payer: Self-pay | Admitting: Sports Medicine

## 2016-09-06 DIAGNOSIS — E6609 Other obesity due to excess calories: Secondary | ICD-10-CM

## 2016-09-06 DIAGNOSIS — E66811 Obesity, class 1: Secondary | ICD-10-CM

## 2016-09-06 DIAGNOSIS — Z6830 Body mass index (BMI) 30.0-30.9, adult: Secondary | ICD-10-CM

## 2016-09-06 MED ORDER — PHENTERMINE HCL 37.5 MG PO TABS: ORAL_TABLET | ORAL | 0 refills | Status: DC

## 2016-09-06 NOTE — Assessment & Plan Note (Signed)
Good weight loss (8lb) after the 2nd month on phentermine, refilling.  Return in 1 month.

## 2016-09-06 NOTE — Progress Notes (Signed)
  Subjective:    CC:  Weight check  HPI: This is a pleasant 36 year old male, after the second month of phentermine he's lost an additional 8 pounds for a total of 30 pounds over 2 months. At this point he is not interested anymore in plastic surgery referral for liposuction, he will revisit this with me if he desires.  Past medical history:  Negative.  See flowsheet/record as well for more information.  Surgical history: Negative.  See flowsheet/record as well for more information.  Family history: Negative.  See flowsheet/record as well for more information.  Social history: Negative.  See flowsheet/record as well for more information.  Allergies, and medications have been entered into the medical record, reviewed, and no changes needed.   Review of Systems: No fevers, chills, night sweats, weight loss, chest pain, or shortness of breath.   Objective:    General: Well Developed, well nourished, and in no acute distress.  Neuro: Alert and oriented x3, extra-ocular muscles intact, sensation grossly intact.  HEENT: Normocephalic, atraumatic, pupils equal round reactive to light, neck supple, no masses, no lymphadenopathy, thyroid nonpalpable.  Skin: Warm and dry, no rashes. Cardiac: Regular rate and rhythm, no murmurs rubs or gallops, no lower extremity edema.  Respiratory: Clear to auscultation bilaterally. Not using accessory muscles, speaking in full sentences.  Impression and Recommendations:    Obesity Good weight loss (8lb) after the 2nd month on phentermine, refilling.  Return in 1 month.

## 2016-09-09 MED FILL — PHENTERMINE 37.5 MG TABLET: 37.5 | 30 days supply | Qty: 30 | Fill #0

## 2016-09-16 ENCOUNTER — Other Ambulatory Visit: Payer: Self-pay | Admitting: Sports Medicine

## 2016-09-16 MED FILL — CLOMIPHENE CITRATE 50 MG TA: 50 | 90 days supply | Qty: 45 | Fill #0

## 2016-10-04 ENCOUNTER — Ambulatory Visit (INDEPENDENT_AMBULATORY_CARE_PROVIDER_SITE_OTHER): Payer: 59 | Admitting: Sports Medicine

## 2016-10-04 ENCOUNTER — Encounter: Payer: Self-pay | Admitting: Sports Medicine

## 2016-10-04 DIAGNOSIS — Z6830 Body mass index (BMI) 30.0-30.9, adult: Secondary | ICD-10-CM

## 2016-10-04 DIAGNOSIS — E6609 Other obesity due to excess calories: Secondary | ICD-10-CM

## 2016-10-04 DIAGNOSIS — E66811 Obesity, class 1: Secondary | ICD-10-CM

## 2016-10-04 MED ORDER — PHENTERMINE HCL 37.5 MG PO TABS: ORAL_TABLET | ORAL | 0 refills | Status: DC

## 2016-10-04 MED FILL — PHENTERMINE 37.5 MG TABLET: 37.5 | 30 days supply | Qty: 30 | Fill #0

## 2016-10-04 NOTE — Progress Notes (Signed)
  Subjective:    CC: Weight check  HPI: Obesity: Jeffery Cooley returns, good continued weight loss after 3 months of phentermine, no complaints, no side effects.  Past medical history:  Negative.  See flowsheet/record as well for more information.  Surgical history: Negative.  See flowsheet/record as well for more information.  Family history: Negative.  See flowsheet/record as well for more information.  Social history: Negative.  See flowsheet/record as well for more information.  Allergies, and medications have been entered into the medical record, reviewed, and no changes needed.   Review of Systems: No fevers, chills, night sweats, weight loss, chest pain, or shortness of breath.   Objective:    General: Well Developed, well nourished, and in no acute distress.  Neuro: Alert and oriented x3, extra-ocular muscles intact, sensation grossly intact.  HEENT: Normocephalic, atraumatic, pupils equal round reactive to light, neck supple, no masses, no lymphadenopathy, thyroid nonpalpable.  Skin: Warm and dry, no rashes. Cardiac: Regular rate and rhythm, no murmurs rubs or gallops, no lower extremity edema.  Respiratory: Clear to auscultation bilaterally. Not using accessory muscles, speaking in full sentences.  Impression and Recommendations:    Obesity Fantastic additional weight loss, refilling phentermine, return in one month. Entering the fourth month.

## 2016-10-04 NOTE — Assessment & Plan Note (Signed)
Fantastic additional weight loss, refilling phentermine, return in one month. Entering the fourth month.

## 2016-11-01 ENCOUNTER — Ambulatory Visit (INDEPENDENT_AMBULATORY_CARE_PROVIDER_SITE_OTHER): Payer: 59 | Admitting: Sports Medicine

## 2016-11-01 ENCOUNTER — Encounter: Payer: Self-pay | Admitting: Sports Medicine

## 2016-11-01 DIAGNOSIS — E6609 Other obesity due to excess calories: Secondary | ICD-10-CM | POA: Diagnosis not present

## 2016-11-01 DIAGNOSIS — Z6830 Body mass index (BMI) 30.0-30.9, adult: Secondary | ICD-10-CM | POA: Diagnosis not present

## 2016-11-01 DIAGNOSIS — E66811 Obesity, class 1: Secondary | ICD-10-CM

## 2016-11-01 MED ORDER — PHENTERMINE HCL 37.5 MG PO TABS: ORAL_TABLET | ORAL | 0 refills | Status: DC

## 2016-11-01 NOTE — Assessment & Plan Note (Signed)
Overall did weight loss, started 240 pounds 4 months ago, we are entering the fifth month. He did gain couple of pounds but has been doing some exercising. Refilling phentermine, return in one month,

## 2016-11-01 NOTE — Progress Notes (Signed)
  Subjective:    CC: Follow-up  HPI:  Obesity: Good continued weight loss, he did gain a couple of pounds over the last month but has overall had a good trend downward. No adverse effects.  Past medical history:  Negative.  See flowsheet/record as well for more information.  Surgical history: Negative.  See flowsheet/record as well for more information.  Family history: Negative.  See flowsheet/record as well for more information.  Social history: Negative.  See flowsheet/record as well for more information.  Allergies, and medications have been entered into the medical record, reviewed, and no changes needed.   Review of Systems: No fevers, chills, night sweats, weight loss, chest pain, or shortness of breath.   Objective:    General: Well Developed, well nourished, and in no acute distress.  Neuro: Alert and oriented x3, extra-ocular muscles intact, sensation grossly intact.  HEENT: Normocephalic, atraumatic, pupils equal round reactive to light, neck supple, no masses, no lymphadenopathy, thyroid nonpalpable.  Skin: Warm and dry, no rashes. Cardiac: Regular rate and rhythm, no murmurs rubs or gallops, no lower extremity edema.  Respiratory: Clear to auscultation bilaterally. Not using accessory muscles, speaking in full sentences.  Impression and Recommendations:    Obesity Overall did weight loss, started 240 pounds 4 months ago, we are entering the fifth month. He did gain couple of pounds but has been doing some exercising. Refilling phentermine, return in one month,

## 2016-11-06 MED FILL — PHENTERMINE 37.5 MG TABLET: 37.5 | 30 days supply | Qty: 30 | Fill #0

## 2016-11-27 ENCOUNTER — Ambulatory Visit (INDEPENDENT_AMBULATORY_CARE_PROVIDER_SITE_OTHER): Payer: 59 | Admitting: Sports Medicine

## 2016-11-27 ENCOUNTER — Encounter: Payer: Self-pay | Admitting: Sports Medicine

## 2016-11-27 DIAGNOSIS — Z6830 Body mass index (BMI) 30.0-30.9, adult: Secondary | ICD-10-CM | POA: Diagnosis not present

## 2016-11-27 DIAGNOSIS — Z0000 Encounter for general adult medical examination without abnormal findings: Secondary | ICD-10-CM | POA: Diagnosis not present

## 2016-11-27 DIAGNOSIS — E6609 Other obesity due to excess calories: Secondary | ICD-10-CM | POA: Diagnosis not present

## 2016-11-27 DIAGNOSIS — E66811 Obesity, class 1: Secondary | ICD-10-CM

## 2016-11-27 DIAGNOSIS — E230 Hypopituitarism: Secondary | ICD-10-CM | POA: Diagnosis not present

## 2016-11-27 MED ORDER — PHENTERMINE HCL 37.5 MG PO TABS: ORAL_TABLET | ORAL | 0 refills | Status: DC

## 2016-11-27 NOTE — Assessment & Plan Note (Addendum)
Checking routine bloodwork. 

## 2016-11-27 NOTE — Assessment & Plan Note (Signed)
Continue Clomid, rechecking testosterone levels. 

## 2016-11-27 NOTE — Assessment & Plan Note (Signed)
After 5 months of phentermine he has plateaued, he's lost 36 pounds. I will cut him back to a half pill, and let him slowly taper over the next 3 months.

## 2016-11-27 NOTE — Progress Notes (Signed)
  Subjective:    CC: Follow-up  HPI: Obesity: Weight loss has plateaued but he is happy with her he is.  Preventive measures:  Due for a physical and routine blood work.  Past medical history:  Negative.  See flowsheet/record as well for more information.  Surgical history: Negative.  See flowsheet/record as well for more information.  Family history: Negative.  See flowsheet/record as well for more information.  Social history: Negative.  See flowsheet/record as well for more information.  Allergies, and medications have been entered into the medical record, reviewed, and no changes needed.   Review of Systems: No fevers, chills, night sweats, weight loss, chest pain, or shortness of breath.   Objective:    General: Well Developed, well nourished, and in no acute distress.  Neuro: Alert and oriented x3, extra-ocular muscles intact, sensation grossly intact.  HEENT: Normocephalic, atraumatic, pupils equal round reactive to light, neck supple, no masses, no lymphadenopathy, thyroid nonpalpable.  Skin: Warm and dry, no rashes. Cardiac: Regular rate and rhythm, no murmurs rubs or gallops, no lower extremity edema.  Respiratory: Clear to auscultation bilaterally. Not using accessory muscles, speaking in full sentences.  Impression and Recommendations:    Obesity After 5 months of phentermine he has plateaued, he's lost 36 pounds. I will cut him back to a half pill, and let him slowly taper over the next 3 months.  Annual physical exam Checking routine blood work.  Hypogonadotropic hypogonadism Continue Clomid, rechecking testosterone levels.

## 2016-12-06 MED FILL — PHENTERMINE 37.5 MG TABLET: 37.5 | 90 days supply | Qty: 45 | Fill #0

## 2016-12-24 ENCOUNTER — Encounter: Payer: Self-pay | Admitting: Sports Medicine

## 2016-12-24 MED ORDER — CLOMIPHENE CITRATE 50 MG PO TABS: 25.0000 mg | ORAL_TABLET | Freq: Every day | ORAL | 3 refills | Status: DC

## 2016-12-24 MED FILL — CLOMIPHENE CITRATE 50 MG TA: 50 | 90 days supply | Qty: 45 | Fill #0

## 2017-01-09 LAB — COMPLETE METABOLIC PANEL WITHOUT GFR
ALT: 21 U/L (ref 9–46)
AST: 22 U/L (ref 10–40)
CO2: 31 mmol/L (ref 20–32)
Calcium: 9.7 mg/dL (ref 8.6–10.3)
Creat: 0.92 mg/dL (ref 0.60–1.35)
GFR, Est Non African American: 105 mL/min/1.73m2 (ref >=60)
Potassium: 4.4 mmol/L (ref 3.5–5.3)

## 2017-01-09 LAB — HEMOGLOBIN A1C
Hgb A1c MFr Bld: 4.7 %{Hb} (ref <5.7)
Mean Plasma Glucose: 88 (calc)
eAG (mmol/L): 4.9 (calc)

## 2017-01-09 LAB — CBC
HCT: 47.4 % (ref 38.5–50.0)
Hemoglobin: 16.3 g/dL (ref 13.2–17.1)
MCH: 31.4 pg (ref 27.0–33.0)
MCHC: 34.4 g/dL (ref 32.0–36.0)
MCV: 91.3 fL (ref 80.0–100.0)
MPV: 9 fL (ref 7.5–12.5)
Platelets: 197 Thousand/uL (ref 140–400)
RBC: 5.19 10*6/uL (ref 4.20–5.80)
RDW: 11.5 % (ref 11.0–15.0)
WBC: 4.3 10*3/uL (ref 3.8–10.8)

## 2017-01-09 LAB — TESTOSTERONE TOTAL,FREE,BIO, MALES
Albumin: 4.8 g/dL (ref 3.6–5.1)
Sex Hormone Binding: 54 nmol/L — ABNORMAL HIGH (ref 10–50)
Testosterone, Bioavailable: 197.7 ng/dL (ref >=110.0)
Testosterone, Free: 90.4 pg/mL (ref 46.0–224.0)
Testosterone: 941 ng/dL — ABNORMAL HIGH (ref 250–827)

## 2017-01-09 LAB — LIPID PANEL W/REFLEX DIRECT LDL
Cholesterol: 140 mg/dL (ref <200)
HDL: 64 mg/dL (ref >40)
LDL Cholesterol (Calc): 63 mg/dL (calc)
Non-HDL Cholesterol (Calc): 76 mg/dL (calc) (ref <130)
Total CHOL/HDL Ratio: 2.2 (calc) (ref <5.0)
Triglycerides: 54 mg/dL (ref <150)

## 2017-01-09 LAB — COMPLETE METABOLIC PANEL WITH GFR
AG Ratio: 2.7 (calc) — ABNORMAL HIGH (ref 1.0–2.5)
Albumin: 4.8 g/dL (ref 3.6–5.1)
Alkaline phosphatase (APISO): 34 U/L — ABNORMAL LOW (ref 40–115)
BUN: 9 mg/dL (ref 7–25)
Chloride: 103 mmol/L (ref 98–110)
GFR, Est African American: 122 mL/min/{1.73_m2} (ref >=60)
Globulin: 1.8 g/dL (calc) — ABNORMAL LOW (ref 1.9–3.7)
Glucose, Bld: 90 mg/dL (ref 65–99)
Sodium: 141 mmol/L (ref 135–146)
Total Bilirubin: 0.6 mg/dL (ref 0.2–1.2)
Total Protein: 6.6 g/dL (ref 6.1–8.1)

## 2017-01-09 LAB — VITAMIN D 25 HYDROXY (VIT D DEFICIENCY, FRACTURES): Vit D, 25-Hydroxy: 41 ng/mL (ref 30–100)

## 2017-01-09 LAB — HIV ANTIBODY (ROUTINE TESTING W REFLEX): HIV 1&2 Ab, 4th Generation: NONREACTIVE

## 2017-01-09 LAB — TSH: TSH: 1.3 m[IU]/L (ref 0.40–4.50)

## 2017-08-14 MED FILL — CLOMIPHENE CITRATE 50 MG TA: 50 | 90 days supply | Qty: 45 | Fill #1

## 2017-09-02 ENCOUNTER — Ambulatory Visit (INDEPENDENT_AMBULATORY_CARE_PROVIDER_SITE_OTHER): Payer: No Typology Code available for payment source | Admitting: Physician Assistant

## 2017-09-02 ENCOUNTER — Encounter: Payer: Self-pay | Admitting: Physician Assistant

## 2017-09-02 VITALS — BP 122/76 | HR 85 | Temp 98.1°F | Wt 227.0 lb

## 2017-09-02 DIAGNOSIS — H65192 Other acute nonsuppurative otitis media, left ear: Secondary | ICD-10-CM

## 2017-09-02 NOTE — Progress Notes (Signed)
HPI:                                                                Jeffery Cooley is a 37 y.o. male who presents to [name]: Primary Care Sports Medicine today for left ear fullness  Reports cold symptoms beginning approximaly 6 days ago included left ear pressure. Symptoms have resolved but ear still feels full. Denies fever, chills, otalgia, otorrhea, hearing loss. Has been taking Afrin and Sudafed.   Depression screen [facility] 2/9 07/12/2016  Decreased Interest 0  Down, Depressed, Hopeless 0  PHQ - 2 Score 0    No flowsheet data found.    No past medical history on file. Past Surgical History:  Procedure Laterality Date  . ARTERY REPAIR     right wrist  . HERNIA REPAIR     Social History   Tobacco Use  . Smoking status: Former Games developer  . Smokeless tobacco: Never Used  Substance Use Topics  . Alcohol use: Yes    Comment: 2-3   family history includes Infertility in his unknown relative.    ROS: negative except as noted in the HPI  Medications: Current Outpatient Medications  Medication Sig Dispense Refill  . clomiPHENE (CLOMID) 50 MG tablet Take 0.5 tablets (25 mg total) by mouth daily. 45 tablet 3   No current facility-administered medications for this visit.    Allergies  Allergen Reactions  . Ceclor [Cefaclor]        Objective:  BP 122/76   Pulse 85   Temp 98.1 F (36.7 C) (Oral)   Wt 227 lb (103 kg)   BMI 28.37 kg/m  Gen:  alert, not ill-appearing, no distress, appropriate for age HEENT: head normocephalic without obvious abnormality, conjunctiva and cornea clear, right TM pearly gray and semi-transparent, left TM with air fluid level, no erythema or bulging of the TM, normal external ear canals bilaterally, trachea midline Pulm: Normal work of breathing, normal phonation Neuro: alert and oriented x 3, no tremor MSK: extremities atraumatic, normal gait and station Skin: intact, no rashes on exposed skin, no jaundice, no  cyanosis    No results found for this or any previous visit (from the past 72 hour(s)). No results found.    Assessment and Plan: 37 y.o. male with   Acute middle ear effusion, left - symptomatic care with oral decongestant and intranasal steroid   Patient education and anticipatory guidance given Patient agrees with treatment plan Follow-up as needed if symptoms worsen or fail to improve  Levonne Hubert PA-C

## 2017-11-27 MED FILL — CLOMIPHENE CITRATE 50 MG TA: 50 | 90 days supply | Qty: 45 | Fill #2

## 2017-11-28 ENCOUNTER — Ambulatory Visit: Payer: Self-pay | Admitting: Physician Assistant

## 2017-12-01 ENCOUNTER — Ambulatory Visit: Payer: Self-pay | Admitting: Physician Assistant

## 2017-12-17 ENCOUNTER — Encounter: Payer: Self-pay | Admitting: Sports Medicine

## 2017-12-17 ENCOUNTER — Ambulatory Visit (INDEPENDENT_AMBULATORY_CARE_PROVIDER_SITE_OTHER): Payer: No Typology Code available for payment source | Admitting: Sports Medicine

## 2017-12-17 DIAGNOSIS — E230 Hypopituitarism: Secondary | ICD-10-CM | POA: Diagnosis not present

## 2017-12-17 DIAGNOSIS — E6609 Other obesity due to excess calories: Secondary | ICD-10-CM

## 2017-12-17 DIAGNOSIS — Z630 Problems in relationship with spouse or partner: Secondary | ICD-10-CM | POA: Diagnosis not present

## 2017-12-17 DIAGNOSIS — Z0000 Encounter for general adult medical examination without abnormal findings: Secondary | ICD-10-CM

## 2017-12-17 DIAGNOSIS — Z6830 Body mass index (BMI) 30.0-30.9, adult: Secondary | ICD-10-CM

## 2017-12-17 DIAGNOSIS — E66811 Obesity, class 1: Secondary | ICD-10-CM

## 2017-12-17 NOTE — Assessment & Plan Note (Signed)
Weight has been as high as 260 pounds and his lowest 202. He is currently at 220, working aggressively in the gym and with dieting.   He would like to try this on his own for now before considering pharmacologic intervention.

## 2017-12-17 NOTE — Progress Notes (Signed)
Subjective:    CC: Annual physical exam  HPI:  This is a pleasant 37 year old male here for his physical, he has no complaints with the exception of some marital discord.  Would like a referral to counseling.  No suicidal or homicidal ideation.  I reviewed the past medical history, family history, social history, surgical history, and allergies today and no changes were needed.  Please see the problem list section below in epic for further details.  Past Medical History: No past medical history on file. Past Surgical History: Past Surgical History:  Procedure Laterality Date  . ARTERY REPAIR     right wrist  . HERNIA REPAIR     Social History: Social History   Socioeconomic History  . Marital status: Single    Spouse name: Not on file  . Number of children: Not on file  . Years of education: Not on file  . Highest education level: Not on file  Occupational History  . Not on file  Social Needs  . Financial resource strain: Not on file  . Food insecurity:    Worry: Not on file    Inability: Not on file  . Transportation needs:    Medical: Not on file    Non-medical: Not on file  Tobacco Use  . Smoking status: Former Games developermoker  . Smokeless tobacco: Never Used  Substance and Sexual Activity  . Alcohol use: Yes    Comment: 2-3  . Drug use: No  . Sexual activity: Not on file  Lifestyle  . Physical activity:    Days per week: Not on file    Minutes per session: Not on file  . Stress: Not on file  Relationships  . Social connections:    Talks on phone: Not on file    Gets together: Not on file    Attends religious service: Not on file    Active member of club or organization: Not on file    Attends meetings of clubs or organizations: Not on file    Relationship status: Not on file  Other Topics Concern  . Not on file  Social History Narrative  . Not on file   Family History: Family History  Problem Relation Age of Onset  . Infertility Unknown     Allergies: Allergies  Allergen Reactions  . Ceclor [Cefaclor]    Medications: See med rec.  Review of Systems: No headache, visual changes, nausea, vomiting, diarrhea, constipation, dizziness, abdominal pain, skin rash, fevers, chills, night sweats, swollen lymph nodes, weight loss, chest pain, body aches, joint swelling, muscle aches, shortness of breath, mood changes, visual or auditory hallucinations.  Objective:    General: Well Developed, well nourished, and in no acute distress.  Neuro: Alert and oriented x3, extra-ocular muscles intact, sensation grossly intact. Cranial nerves II through XII are intact, motor, sensory, and coordinative functions are all intact. HEENT: Normocephalic, atraumatic, pupils equal round reactive to light, neck supple, no masses, no lymphadenopathy, thyroid nonpalpable. Oropharynx, nasopharynx, external ear canals are unremarkable. Skin: Warm and dry, no rashes noted.  Cardiac: Regular rate and rhythm, no murmurs rubs or gallops.  Respiratory: Clear to auscultation bilaterally. Not using accessory muscles, speaking in full sentences.  Abdominal: Soft, nontender, nondistended, positive bowel sounds, no masses, no organomegaly.  Musculoskeletal: Shoulder, elbow, wrist, hip, knee, ankle stable, and with full range of motion.  Impression and Recommendations:    The patient was counselled, risk factors were discussed, anticipatory guidance given.  Annual physical exam Routine physical as  above. Checking routine labs.  Hypogonadotropic hypogonadism Hypogonadotrophic hypogonadism, doing well with Clomid.   Rechecking free and total testosterone levels.  Obesity Weight has been as high as 260 pounds and his lowest 202. He is currently at 220, working aggressively in the gym and with dieting.   He would like to try this on his own for now before considering pharmacologic intervention.  Marital conflict Ref[facility] ___________________________________________ Ihor Austinhomas J. Benjamin Stainhekkekandam, M.D., ABFM., CAQSM. Primary Care and Sports Medicine [facility]  Adjunct Instructor of Family Medicine  [employer]

## 2017-12-17 NOTE — Assessment & Plan Note (Signed)
[facility]

## 2017-12-17 NOTE — Assessment & Plan Note (Signed)
Routine physical as above. Checking routine labs.  

## 2017-12-17 NOTE — Assessment & Plan Note (Signed)
Hypogonadotrophic hypogonadism, doing well with Clomid.   Rechecking free and total testosterone levels.

## 2017-12-22 LAB — COMPREHENSIVE METABOLIC PANEL
ALT: 21 U/L (ref 9–46)
AST: 18 U/L (ref 10–40)
Alkaline phosphatase (APISO): 34 U/L — ABNORMAL LOW (ref 40–115)
BUN: 9 mg/dL (ref 7–25)
Chloride: 107 mmol/L (ref 98–110)
Creat: 0.97 mg/dL (ref 0.60–1.35)
Globulin: 2.1 g/dL (calc) (ref 1.9–3.7)
Glucose, Bld: 95 mg/dL (ref 65–99)
Sodium: 144 mmol/L (ref 135–146)
Total Protein: 6.6 g/dL (ref 6.1–8.1)

## 2017-12-22 LAB — HEMOGLOBIN A1C
Hgb A1c MFr Bld: 4.8 % of total Hgb (ref <5.7)
Mean Plasma Glucose: 91 (calc)
eAG (mmol/L): 5 (calc)

## 2017-12-22 LAB — COMPREHENSIVE METABOLIC PANEL WITH GFR
AG Ratio: 2.1 (calc) (ref 1.0–2.5)
Albumin: 4.5 g/dL (ref 3.6–5.1)
CO2: 31 mmol/L (ref 20–32)
Calcium: 9.5 mg/dL (ref 8.6–10.3)
Potassium: 4.8 mmol/L (ref 3.5–5.3)
Total Bilirubin: 0.5 mg/dL (ref 0.2–1.2)

## 2017-12-22 LAB — LIPID PANEL W/REFLEX DIRECT LDL
Cholesterol: 141 mg/dL (ref <200)
HDL: 41 mg/dL (ref >40)
LDL Cholesterol (Calc): 82 mg/dL
Non-HDL Cholesterol (Calc): 100 mg/dL (calc) (ref <130)
Total CHOL/HDL Ratio: 3.4 (calc) (ref <5.0)
Triglycerides: 87 mg/dL (ref <150)

## 2017-12-22 LAB — CBC
HCT: 50.7 % — ABNORMAL HIGH (ref 38.5–50.0)
Hemoglobin: 17.1 g/dL (ref 13.2–17.1)
MCH: 32.2 pg (ref 27.0–33.0)
MCHC: 33.7 g/dL (ref 32.0–36.0)
MCV: 95.5 fL (ref 80.0–100.0)
MPV: 9.2 fL (ref 7.5–12.5)
Platelets: 186 10*3/uL (ref 140–400)
RBC: 5.31 10*6/uL (ref 4.20–5.80)
RDW: 11.8 % (ref 11.0–15.0)
WBC: 4.4 Thousand/uL (ref 3.8–10.8)

## 2017-12-22 LAB — TESTOSTERONE, FREE & TOTAL
Free Testosterone: 154.8 pg/mL (ref 35.0–155.0)
Testosterone, Total, LC-MS-MS: 855 ng/dL (ref 250–1100)

## 2017-12-22 LAB — TSH: TSH: 1.5 m[IU]/L (ref 0.40–4.50)

## 2018-04-20 ENCOUNTER — Encounter: Payer: Self-pay | Admitting: Sports Medicine

## 2018-04-20 MED ORDER — CLOMIPHENE CITRATE 50 MG PO TABS: 25.0000 mg | ORAL_TABLET | Freq: Every day | ORAL | 3 refills | Status: DC

## 2018-04-20 MED FILL — CLOMIPHENE CITRATE 50 MG TA: 50 | 90 days supply | Qty: 45 | Fill #0

## 2018-05-18 ENCOUNTER — Encounter: Payer: Self-pay | Admitting: Sports Medicine

## 2018-06-01 ENCOUNTER — Other Ambulatory Visit: Payer: Self-pay | Admitting: Physician Assistant

## 2018-06-01 MED ORDER — OSELTAMIVIR PHOSPHATE 75 MG PO CAPS: 75.0000 mg | ORAL_CAPSULE | Freq: Every day | ORAL | 0 refills | Status: DC

## 2018-06-01 MED FILL — OSELTAMIVIR PHOS 75 MG CAP: 75 | 10 days supply | Qty: 10 | Fill #0

## 2018-06-01 NOTE — Progress Notes (Signed)
Daughter confirmed flu. Sent preventative.

## 2018-06-09 ENCOUNTER — Encounter: Payer: Self-pay | Admitting: Physician Assistant

## 2018-06-09 ENCOUNTER — Ambulatory Visit (INDEPENDENT_AMBULATORY_CARE_PROVIDER_SITE_OTHER): Payer: No Typology Code available for payment source | Admitting: Physician Assistant

## 2018-06-09 VITALS — BP 118/77 | HR 72 | Temp 98.8°F | Resp 14 | Wt 220.0 lb

## 2018-06-09 DIAGNOSIS — L03116 Cellulitis of left lower limb: Secondary | ICD-10-CM

## 2018-06-09 DIAGNOSIS — L02416 Cutaneous abscess of left lower limb: Secondary | ICD-10-CM | POA: Diagnosis not present

## 2018-06-09 MED ORDER — DOXYCYCLINE HYCLATE 100 MG PO TABS: 100.0000 mg | ORAL_TABLET | Freq: Two times a day (BID) | ORAL | 0 refills | Status: AC

## 2018-06-09 MED FILL — DOXYCYCLINE HYCLATE 100 MG: 100 | 7 days supply | Qty: 14 | Fill #0

## 2018-06-09 NOTE — Patient Instructions (Signed)

## 2018-06-09 NOTE — Progress Notes (Signed)
HPI:                                                                Jeffery Cooley is a 38 y.o. male who presents to [name]: Primary Care Sports Medicine today for skin infection  For approximately 3 weeks patient has had a recurrent skin lesion that he describes as a large "pimple" on his left medial upper thigh. He has been applying warm compresses and has self expressed some pus.  He states he thought the area was improving but he developed increased redness and pain yesterday with increased drainage. Denies fever, chills, malaise.  No past medical history on file. Past Surgical History:  Procedure Laterality Date  . ARTERY REPAIR     right wrist  . HERNIA REPAIR     Social History   Tobacco Use  . Smoking status: Former Games developer  . Smokeless tobacco: Never Used  Substance Use Topics  . Alcohol use: Yes    Comment: 2-3   family history includes Infertility in his unknown relative.    ROS: negative except as noted in the HPI  Medications: Current Outpatient Medications  Medication Sig Dispense Refill  . clomiPHENE (CLOMID) 50 MG tablet Take 0.5 tablets (25 mg total) by mouth daily. 45 tablet 3  . doxycycline (VIBRA-TABS) 100 MG tablet Take 1 tablet (100 mg total) by mouth 2 (two) times daily for 7 days. 14 tablet 0   No current facility-administered medications for this visit.    Allergies  Allergen Reactions  . Ceclor [Cefaclor]        Objective:  BP 118/77   Pulse 72   Temp 98.8 F (37.1 C)   Resp 14   Wt 220 lb (99.8 kg)   BMI 27.50 kg/m  Physical Exam  Constitutional: Vital signs are normal. He is active. He does not appear ill.  Neurological: He is alert.  Skin:         No results found for this or any previous visit (from the past 72 hour(s)). No results found.    Assessment and Plan: 38 y.o. male with   .[name] was seen today for cyst.  Diagnoses and all orders for this visit:  Cellulitis of left thigh -      doxycycline (VIBRA-TABS) 100 MG tablet; Take 1 tablet (100 mg total) by mouth 2 (two) times daily for 7 days.  Abscess of left thigh    Will defer I&D for now. Abscess is small, patient has self-expressed a lot of drainage on his own already today and I don't feel much fluctuance. There is mostly surrounding cellulitis. Cont warm compresses Q4H Start Doxycycline If no improvement in 2 days, will perform I&D   Patient education and anticipatory guidance given Patient agrees with treatment plan Follow-up as needed if symptoms worsen or fail to improve  Levonne Hubert PA-C

## 2018-06-14 ENCOUNTER — Encounter: Payer: Self-pay | Admitting: Physician Assistant

## 2018-07-28 ENCOUNTER — Ambulatory Visit (INDEPENDENT_AMBULATORY_CARE_PROVIDER_SITE_OTHER): Payer: No Typology Code available for payment source | Admitting: Sports Medicine

## 2018-07-28 ENCOUNTER — Encounter: Payer: Self-pay | Admitting: Sports Medicine

## 2018-07-28 ENCOUNTER — Other Ambulatory Visit: Payer: Self-pay

## 2018-07-28 DIAGNOSIS — J029 Acute pharyngitis, unspecified: Secondary | ICD-10-CM

## 2018-07-28 NOTE — Progress Notes (Signed)
Virtual Visit via Telephone   I connected with  Coralie Carpen  on 07/28/18 via WebEx and verified that I am speaking with the correct person using two identifiers.   I discussed the limitations, risks, security and privacy concerns of performing an evaluation and management service by WebEx, including the higher likelihood of inaccurate diagnosis and treatment, and the availability of in person appointments.  We also discussed the likely need of an additional face to face encounter for complete and high quality delivery of care.  I also discussed with the patient that there may be a patient responsible charge related to this service. The patient expressed understanding and wishes to proceed.  Subjective:    CC: Sore throat  HPI: For the past couple of days this pleasant 38 year old male has had a sore throat, runny nose, no cough, no fevers, chills, no GI symptoms or skin rash.  No muscle aches, body aches.  His daughter has similar symptoms.  No itchy eyes.  Symptoms are mild, worsening.  I am unable to perform the nearly essential physical exam here, however he tells me he has no tender, swollen lymph nodes, we tried to look in his throat but the camera resolution as well as angle of the flashlight was insufficient to fully determine if he had tonsillar exudates, but he tells me he sees no swollen tonsils or whitish exudates.  I reviewed the past medical history, family history, social history, surgical history, and allergies today and no changes were needed.  Please see the problem list section below in epic for further details.  Past Medical History: No past medical history on file. Past Surgical History: Past Surgical History:  Procedure Laterality Date  . ARTERY REPAIR     right wrist  . HERNIA REPAIR     Social History: Social History   Socioeconomic History  . Marital status: Single    Spouse name: Not on file  . Number of children: Not on file  . Years of education: Not  on file  . Highest education level: Not on file  Occupational History  . Not on file  Social Needs  . Financial resource strain: Not on file  . Food insecurity:    Worry: Not on file    Inability: Not on file  . Transportation needs:    Medical: Not on file    Non-medical: Not on file  Tobacco Use  . Smoking status: Former Games developer  . Smokeless tobacco: Never Used  Substance and Sexual Activity  . Alcohol use: Yes    Comment: 2-3  . Drug use: No  . Sexual activity: Not on file  Lifestyle  . Physical activity:    Days per week: Not on file    Minutes per session: Not on file  . Stress: Not on file  Relationships  . Social connections:    Talks on phone: Not on file    Gets together: Not on file    Attends religious service: Not on file    Active member of club or organization: Not on file    Attends meetings of clubs or organizations: Not on file    Relationship status: Not on file  Other Topics Concern  . Not on file  Social History Narrative  . Not on file   Family History: Family History  Problem Relation Age of Onset  . Infertility Unknown    Allergies: Allergies  Allergen Reactions  . Ceclor [Cefaclor]    Medications: See med  rec.  Review of Systems: No fevers, chills, night sweats, weight loss, chest pain, or shortness of breath.   Objective:    General: Speaking full sentences, no audible heavy breathing.  Sounds alert and appropriately interactive.  Appears well.  Face symmetric.  Extraocular movements intact.  Pupils equal and round.  No nasal flaring or accessory muscle use visualized.  No other physical exam performed due to the non-physical nature of this visit.  Impression and Recommendations:    Pharyngitis Joe has pharyngitis, I am unsure as to whether this is viral or simply allergic, his daughter did have similar symptoms. He has no flulike symptoms, and a Centor score of 1. He will use over-the-counter symptomatic management as well as  Flonase for postnasal drip. I will see him back in the office in person in a week if he is not better.  I discussed the above assessment and treatment plan with the patient. The patient was provided an opportunity to ask questions and all were answered. The patient agreed with the plan and demonstrated an understanding of the instructions.   The patient was advised to call back or seek an in-person evaluation if the symptoms worsen or if the condition fails to improve as anticipated.   I provided 20 minutes of electronic video evaluation time during this encounter, less than 50% was time needed to gather information, review chart and records, explain the treatment plan to the patient, and complete documentation.   ___________________________________________ [name]. Benjamin Stain, M.D., ABFM., CAQSM. Primary Care and Sports Medicine [facility]  Adjunct Professor of Family Medicine  [employer]

## 2018-07-28 NOTE — Assessment & Plan Note (Signed)
Jeffery Cooley has pharyngitis, I am unsure as to whether this is viral or simply allergic, his daughter did have similar symptoms. He has no flulike symptoms, and a Centor score of 1. He will use over-the-counter symptomatic management as well as Flonase for postnasal drip. I will see him back in the office in person in a week if he is not better.

## 2018-08-26 MED FILL — CLOMIPHENE CITRATE 50 MG TA: 50 | 90 days supply | Qty: 45 | Fill #1

## 2018-09-10 MED FILL — CLOMIPHENE CITRATE 50 MG TA: 50 | 90 days supply | Qty: 45 | Fill #1

## 2018-12-18 ENCOUNTER — Telehealth: Payer: Self-pay | Admitting: Sports Medicine

## 2018-12-18 MED ORDER — CLOMIPHENE CITRATE 50 MG PO TABS: 25.0000 mg | ORAL_TABLET | Freq: Every day | ORAL | 3 refills | Status: DC

## 2018-12-18 NOTE — Telephone Encounter (Signed)
Patient is agreeable for you to send this to [facility]. I have put the correct pharmacy in the chart per patient request.

## 2018-12-18 NOTE — Telephone Encounter (Signed)
Received fax from [employer] that Clomid requires a PA. Information has been sent to the insurance company. Awaiting determination.

## 2018-12-18 NOTE — Telephone Encounter (Signed)
I received a fax from [employer] that Clomid is no longer covered under this patient and is a plan exclusion. There is no option to appeal. Please advise.

## 2018-12-18 NOTE — Telephone Encounter (Signed)
Done, I have also added the correct discount coupon information into the prescription.

## 2018-12-18 NOTE — Telephone Encounter (Signed)
45 tablets (90 day supply for him) is only $38 at [facility] with a good Rx coupon, I am happy to send it there if he would like.

## 2019-07-01 ENCOUNTER — Other Ambulatory Visit: Payer: Self-pay

## 2019-07-01 ENCOUNTER — Encounter: Payer: Self-pay | Admitting: Sports Medicine

## 2019-07-01 ENCOUNTER — Ambulatory Visit (INDEPENDENT_AMBULATORY_CARE_PROVIDER_SITE_OTHER): Payer: No Typology Code available for payment source | Admitting: Sports Medicine

## 2019-07-01 VITALS — BP 118/75 | HR 86 | Ht 75.0 in | Wt 246.0 lb

## 2019-07-01 DIAGNOSIS — Z0000 Encounter for general adult medical examination without abnormal findings: Secondary | ICD-10-CM

## 2019-07-01 DIAGNOSIS — E230 Hypopituitarism: Secondary | ICD-10-CM | POA: Diagnosis not present

## 2019-07-01 DIAGNOSIS — Z113 Encounter for screening for infections with a predominantly sexual mode of transmission: Secondary | ICD-10-CM | POA: Diagnosis not present

## 2019-07-01 DIAGNOSIS — E6609 Other obesity due to excess calories: Secondary | ICD-10-CM | POA: Diagnosis not present

## 2019-07-01 DIAGNOSIS — N529 Male erectile dysfunction, unspecified: Secondary | ICD-10-CM

## 2019-07-01 DIAGNOSIS — Z630 Problems in relationship with spouse or partner: Secondary | ICD-10-CM | POA: Diagnosis not present

## 2019-07-01 DIAGNOSIS — Z6830 Body mass index (BMI) 30.0-30.9, adult: Secondary | ICD-10-CM

## 2019-07-01 DIAGNOSIS — E66811 Obesity, class 1: Secondary | ICD-10-CM

## 2019-07-01 MED ORDER — PHENTERMINE HCL 37.5 MG PO TABS: ORAL_TABLET | ORAL | 0 refills | Status: DC

## 2019-07-01 MED ORDER — TADALAFIL 5 MG PO TABS: 5.0000 mg | ORAL_TABLET | Freq: Every day | ORAL | 11 refills | Status: DC | PRN

## 2019-07-01 NOTE — Assessment & Plan Note (Signed)
Adding full STD screening

## 2019-07-01 NOTE — Assessment & Plan Note (Signed)
Now happily divorced.

## 2019-07-01 NOTE — Assessment & Plan Note (Signed)
Switching from sildenafil to low-dose tadalafil.

## 2019-07-01 NOTE — Assessment & Plan Note (Signed)
Restarting phentermine, return monthly for weight checks and refills. 

## 2019-07-01 NOTE — Assessment & Plan Note (Signed)
Routine annual physical as above. Checking routine labs.

## 2019-07-01 NOTE — Assessment & Plan Note (Signed)
Continue Clomid, rechecking testosterone levels.

## 2019-07-01 NOTE — Progress Notes (Signed)
Subjective:    CC: Annual Physical Exam  HPI:  This patient is here for their annual physical  I reviewed the past medical history, family history, social history, surgical history, and allergies today and no changes were needed.  Please see the problem list section below in epic for further details.  Past Medical History: No past medical history on file. Past Surgical History: Past Surgical History:  Procedure Laterality Date  . ARTERY REPAIR     right wrist  . HERNIA REPAIR     Social History: Social History   Socioeconomic History  . Marital status: Single    Spouse name: Not on file  . Number of children: Not on file  . Years of education: Not on file  . Highest education level: Not on file  Occupational History  . Not on file  Tobacco Use  . Smoking status: Former Research scientist (life sciences)  . Smokeless tobacco: Never Used  Substance and Sexual Activity  . Alcohol use: Yes    Comment: 2-3  . Drug use: No  . Sexual activity: Not on file  Other Topics Concern  . Not on file  Social History Narrative  . Not on file   Social Determinants of Health   Financial Resource Strain:   . Difficulty of Paying Living Expenses: Not on file  Food Insecurity:   . Worried About Charity fundraiser in the Last Year: Not on file  . Ran Out of Food in the Last Year: Not on file  Transportation Needs:   . Lack of Transportation (Medical): Not on file  . Lack of Transportation (Non-Medical): Not on file  Physical Activity:   . Days of Exercise per Week: Not on file  . Minutes of Exercise per Session: Not on file  Stress:   . Feeling of Stress : Not on file  Social Connections:   . Frequency of Communication with Friends and Family: Not on file  . Frequency of Social Gatherings with Friends and Family: Not on file  . Attends Religious Services: Not on file  . Active Member of Clubs or Organizations: Not on file  . Attends Archivist Meetings: Not on file  . Marital Status: Not on  file   Family History: Family History  Problem Relation Age of Onset  . Infertility Unknown    Allergies: Allergies  Allergen Reactions  . Ceclor [Cefaclor]    Medications: See med rec.  Review of Systems: No headache, visual changes, nausea, vomiting, diarrhea, constipation, dizziness, abdominal pain, skin rash, fevers, chills, night sweats, swollen lymph nodes, weight loss, chest pain, body aches, joint swelling, muscle aches, shortness of breath, mood changes, visual or auditory hallucinations.  Objective:    General: Well Developed, well nourished, and in no acute distress.  Neuro: Alert and oriented x3, extra-ocular muscles intact, sensation grossly intact. Cranial nerves II through XII are intact, motor, sensory, and coordinative functions are all intact. HEENT: Normocephalic, atraumatic, pupils equal round reactive to light, neck supple, no masses, no lymphadenopathy, thyroid nonpalpable. Oropharynx, nasopharynx, external ear canals are unremarkable. Skin: Warm and dry, no rashes noted.  Cardiac: Regular rate and rhythm, no murmurs rubs or gallops.  Respiratory: Clear to auscultation bilaterally. Not using accessory muscles, speaking in full sentences.  Abdominal: Soft, nontender, nondistended, positive bowel sounds, no masses, no organomegaly.  Musculoskeletal: Shoulder, elbow, wrist, hip, knee, ankle stable, and with full range of motion.  Impression and Recommendations:    The patient was counselled, risk factors were discussed,  anticipatory guidance given.  Annual physical exam Routine annual physical as above. Checking routine labs.  Hypogonadotropic hypogonadism Continue Clomid, rechecking testosterone levels.  Marital conflict Now happily divorced.  Obesity Restarting phentermine, return monthly for weight checks and refills.  Screening for STD (sexually transmitted disease) Adding full STD screening  Erectile dysfunction Switching from sildenafil to  low-dose tadalafil.   ___________________________________________ [name]. Benjamin Stain, M.D., ABFM., CAQSM. Primary Care and Sports Medicine [facility]  Adjunct Professor of Family Medicine  [employer]

## 2019-07-05 LAB — CBC
HCT: 52.6 % — ABNORMAL HIGH (ref 38.5–50.0)
Hemoglobin: 18 g/dL — ABNORMAL HIGH (ref 13.2–17.1)
MCH: 32.1 pg (ref 27.0–33.0)
MCHC: 34.2 g/dL (ref 32.0–36.0)
MCV: 93.9 fL (ref 80.0–100.0)
MPV: 8.8 fL (ref 7.5–12.5)
Platelets: 198 10*3/uL (ref 140–400)
RBC: 5.6 10*6/uL (ref 4.20–5.80)
RDW: 11.6 % (ref 11.0–15.0)
WBC: 6 10*3/uL (ref 3.8–10.8)

## 2019-07-05 LAB — C. TRACHOMATIS/N. GONORRHOEAE RNA
C. trachomatis RNA, TMA: NOT DETECTED
N. gonorrhoeae RNA, TMA: NOT DETECTED

## 2019-07-05 LAB — COMPLETE METABOLIC PANEL WITH GFR
AG Ratio: 2 (calc) (ref 1.0–2.5)
ALT: 21 U/L (ref 9–46)
AST: 19 U/L (ref 10–40)
Albumin: 4.8 g/dL (ref 3.6–5.1)
Alkaline phosphatase (APISO): 35 U/L — ABNORMAL LOW (ref 36–130)
BUN: 13 mg/dL (ref 7–25)
CO2: 29 mmol/L (ref 20–32)
Calcium: 9.5 mg/dL (ref 8.6–10.3)
Chloride: 104 mmol/L (ref 98–110)
Creat: 0.86 mg/dL (ref 0.60–1.35)
GFR, Est African American: 126 mL/min/{1.73_m2} (ref >=60)
GFR, Est Non African American: 108 mL/min/{1.73_m2} (ref >=60)
Globulin: 2.4 g/dL (calc) (ref 1.9–3.7)
Glucose, Bld: 90 mg/dL (ref 65–99)
Potassium: 4.5 mmol/L (ref 3.5–5.3)
Sodium: 139 mmol/L (ref 135–146)
Total Bilirubin: 0.6 mg/dL (ref 0.2–1.2)
Total Protein: 7.2 g/dL (ref 6.1–8.1)

## 2019-07-05 LAB — LIPID PANEL W/REFLEX DIRECT LDL
Cholesterol: 188 mg/dL (ref <200)
HDL: 40 mg/dL (ref >=40)
LDL Cholesterol (Calc): 126 mg/dL (calc) — ABNORMAL HIGH
Non-HDL Cholesterol (Calc): 148 mg/dL (calc) — ABNORMAL HIGH (ref <130)
Total CHOL/HDL Ratio: 4.7 (calc) (ref <5.0)
Triglycerides: 112 mg/dL (ref <150)

## 2019-07-05 LAB — TESTOSTERONE, FREE & TOTAL
Free Testosterone: 176.6 pg/mL — ABNORMAL HIGH (ref 35.0–155.0)
Testosterone, Total, LC-MS-MS: 936 ng/dL (ref 250–1100)

## 2019-07-05 LAB — HEPATITIS PANEL, ACUTE
Hep A IgM: NONREACTIVE
Hep B C IgM: NONREACTIVE
Hepatitis B Surface Ag: NONREACTIVE
Hepatitis C Ab: NONREACTIVE
SIGNAL TO CUT-OFF: 0.01 (ref <1.00)

## 2019-07-05 LAB — HIV ANTIBODY (ROUTINE TESTING W REFLEX): HIV 1&2 Ab, 4th Generation: NONREACTIVE

## 2019-07-05 LAB — RPR: RPR Ser Ql: NONREACTIVE

## 2019-07-05 LAB — TSH: TSH: 1.26 mIU/L (ref 0.40–4.50)

## 2019-07-27 ENCOUNTER — Ambulatory Visit (INDEPENDENT_AMBULATORY_CARE_PROVIDER_SITE_OTHER): Payer: No Typology Code available for payment source | Admitting: Sports Medicine

## 2019-07-27 ENCOUNTER — Encounter: Payer: Self-pay | Admitting: Sports Medicine

## 2019-07-27 ENCOUNTER — Other Ambulatory Visit: Payer: Self-pay

## 2019-07-27 DIAGNOSIS — Z6830 Body mass index (BMI) 30.0-30.9, adult: Secondary | ICD-10-CM | POA: Diagnosis not present

## 2019-07-27 DIAGNOSIS — E66811 Obesity, class 1: Secondary | ICD-10-CM

## 2019-07-27 DIAGNOSIS — E6609 Other obesity due to excess calories: Secondary | ICD-10-CM | POA: Diagnosis not present

## 2019-07-27 DIAGNOSIS — E230 Hypopituitarism: Secondary | ICD-10-CM

## 2019-07-27 MED ORDER — PHENTERMINE HCL 37.5 MG PO TABS: ORAL_TABLET | ORAL | 0 refills | Status: DC

## 2019-07-27 NOTE — Assessment & Plan Note (Signed)
Patient continues with clomiphene for hypogonadotrophic hypogonadism. His testosterone levels recently have been a bit high. At his next refill he plans to drop to 1/4 tablet and then we can recheck his testosterone levels 1 to 2 months later.

## 2019-07-27 NOTE — Progress Notes (Signed)
    Procedures performed today:    None.  Independent interpretation of notes and tests performed by another provider:   None.  Brief History, Exam, Impression, and Recommendations:    Hypogonadotropic hypogonadism Patient continues with clomiphene for hypogonadotrophic hypogonadism. His testosterone levels recently have been a bit high. At his next refill he plans to drop to 1/4 tablet and then we can recheck his testosterone levels 1 to 2 months later.  Obesity Jeffery Cooley returns, he has had a 3 pound weight loss after the first month on phentermine, he has been weighing at home and feels as though the weight loss is more like 10 pounds. Continuing medication as we entered the second month, goal is 10-15 more pounds of weight loss.    ___________________________________________ [name]. Benjamin Stain, M.D., ABFM., CAQSM. Primary Care and Sports Medicine [facility]  Adjunct Instructor of Family Medicine  [employer]

## 2019-07-27 NOTE — Assessment & Plan Note (Signed)
Jeffery Cooley returns, he has had a 3 pound weight loss after the first month on phentermine, he has been weighing at home and feels as though the weight loss is more like 10 pounds. Continuing medication as we entered the second month, goal is 10-15 more pounds of weight loss.

## 2019-08-03 ENCOUNTER — Encounter (HOSPITAL_COMMUNITY)
Admission: EM | Disposition: A | Discharge status: Home Health | Payer: Self-pay | Source: Home / Self Care | Attending: Student

## 2019-08-03 ENCOUNTER — Inpatient Hospital Stay (HOSPITAL_COMMUNITY)
Admission: EM | Admit: 2019-08-03 | Discharge: 2019-08-07 | DRG: 494 | Disposition: A | Discharge status: Home Health | Payer: No Typology Code available for payment source | Attending: Student | Admitting: Student

## 2019-08-03 ENCOUNTER — Inpatient Hospital Stay (HOSPITAL_COMMUNITY): Payer: No Typology Code available for payment source | Admitting: Anesthesiology

## 2019-08-03 ENCOUNTER — Encounter (HOSPITAL_COMMUNITY): Payer: Self-pay | Admitting: Orthopedic Surgery

## 2019-08-03 ENCOUNTER — Inpatient Hospital Stay (HOSPITAL_COMMUNITY): Payer: No Typology Code available for payment source

## 2019-08-03 ENCOUNTER — Other Ambulatory Visit: Payer: Self-pay

## 2019-08-03 ENCOUNTER — Emergency Department (HOSPITAL_COMMUNITY): Payer: No Typology Code available for payment source

## 2019-08-03 DIAGNOSIS — Z20822 Contact with and (suspected) exposure to covid-19: Secondary | ICD-10-CM | POA: Diagnosis present

## 2019-08-03 DIAGNOSIS — Z881 Allergy status to other antibiotic agents status: Secondary | ICD-10-CM | POA: Diagnosis not present

## 2019-08-03 DIAGNOSIS — X58XXXA Exposure to other specified factors, initial encounter: Secondary | ICD-10-CM

## 2019-08-03 DIAGNOSIS — S82192A Other fracture of upper end of left tibia, initial encounter for closed fracture: Secondary | ICD-10-CM

## 2019-08-03 DIAGNOSIS — E669 Obesity, unspecified: Secondary | ICD-10-CM | POA: Diagnosis present

## 2019-08-03 DIAGNOSIS — S82142A Displaced bicondylar fracture of left tibia, initial encounter for closed fracture: Secondary | ICD-10-CM | POA: Diagnosis present

## 2019-08-03 DIAGNOSIS — Z6830 Body mass index (BMI) 30.0-30.9, adult: Secondary | ICD-10-CM

## 2019-08-03 DIAGNOSIS — Y92410 Unspecified street and highway as the place of occurrence of the external cause: Secondary | ICD-10-CM

## 2019-08-03 DIAGNOSIS — F17290 Nicotine dependence, other tobacco product, uncomplicated: Secondary | ICD-10-CM | POA: Diagnosis present

## 2019-08-03 DIAGNOSIS — T148XXA Other injury of unspecified body region, initial encounter: Secondary | ICD-10-CM

## 2019-08-03 DIAGNOSIS — Z79899 Other long term (current) drug therapy: Secondary | ICD-10-CM | POA: Diagnosis not present

## 2019-08-03 DIAGNOSIS — Z419 Encounter for procedure for purposes other than remedying health state, unspecified: Secondary | ICD-10-CM

## 2019-08-03 HISTORY — PX: EXTERNAL FIXATION LEG: SHX1549

## 2019-08-03 LAB — CBC WITH DIFFERENTIAL/PLATELET
Abs Immature Granulocytes: 0.06 K/uL (ref 0.00–0.07)
Basophils Absolute: 0.1 K/uL (ref 0.0–0.1)
Basophils Relative: 1 %
Eosinophils Absolute: 0.1 K/uL (ref 0.0–0.5)
Eosinophils Relative: 0 %
HCT: 48.8 % (ref 39.0–52.0)
Hemoglobin: 16.6 g/dL (ref 13.0–17.0)
Immature Granulocytes: 1 %
Lymphocytes Relative: 7 %
Lymphs Abs: 0.9 K/uL (ref 0.7–4.0)
MCH: 32.4 pg (ref 26.0–34.0)
MCHC: 34 g/dL (ref 30.0–36.0)
MCV: 95.1 fL (ref 80.0–100.0)
Monocytes Absolute: 0.7 K/uL (ref 0.1–1.0)
Monocytes Relative: 5 %
Neutro Abs: 11.3 K/uL — ABNORMAL HIGH (ref 1.7–7.7)
Neutrophils Relative %: 86 %
Platelets: 180 K/uL (ref 150–400)
RBC: 5.13 MIL/uL (ref 4.22–5.81)
RDW: 12.1 % (ref 11.5–15.5)
WBC: 13 K/uL — ABNORMAL HIGH (ref 4.0–10.5)
nRBC: 0 % (ref 0.0–0.2)

## 2019-08-03 LAB — BASIC METABOLIC PANEL
Anion gap: 10 (ref 5–15)
BUN: 9 mg/dL (ref 6–20)
CO2: 26 mmol/L (ref 22–32)
Calcium: 9 mg/dL (ref 8.9–10.3)
Chloride: 103 mmol/L (ref 98–111)
Creatinine, Ser: 0.92 mg/dL (ref 0.61–1.24)
GFR calc Af Amer: >60 mL/min (ref >60)
GFR calc non Af Amer: >60 mL/min (ref >60)
Glucose, Bld: 127 mg/dL — ABNORMAL HIGH (ref 70–99)
Potassium: 3.7 mmol/L (ref 3.5–5.1)
Sodium: 139 mmol/L (ref 135–145)

## 2019-08-03 LAB — RESPIRATORY PANEL BY RT PCR (FLU A&B, COVID)
Influenza A by PCR: NEGATIVE
Influenza B by PCR: NEGATIVE
SARS Coronavirus 2 by RT PCR: NEGATIVE

## 2019-08-03 SURGERY — EXTERNAL FIXATION, LOWER EXTREMITY
Anesthesia: General | Site: Leg Lower | Laterality: Left

## 2019-08-03 MED ORDER — HYDROMORPHONE HCL 1 MG/ML IJ SOLN
0.2500 mg | INTRAMUSCULAR | Status: AC | PRN
  Administered 2019-08-03 (×8): 0.5 mg via INTRAVENOUS

## 2019-08-03 MED ORDER — VANCOMYCIN HCL 1500 MG/300ML IV SOLN
1500.0000 mg | INTRAVENOUS | Status: DC
  Filled 2019-08-03: qty 300

## 2019-08-03 MED ORDER — CEFAZOLIN SODIUM-DEXTROSE 2-4 GM/100ML-% IV SOLN
INTRAVENOUS | Status: AC
  Filled 2019-08-03: qty 100

## 2019-08-03 MED ORDER — OXYCODONE HCL 5 MG PO TABS
5.0000 mg | ORAL_TABLET | ORAL | Status: DC | PRN
  Administered 2019-08-05 – 2019-08-06 (×4): 10 mg via ORAL
  Filled 2019-08-03 (×6): qty 2

## 2019-08-03 MED ORDER — FENTANYL CITRATE (PF) 250 MCG/5ML IJ SOLN
INTRAMUSCULAR | Status: AC
  Filled 2019-08-03: qty 5

## 2019-08-03 MED ORDER — METHOCARBAMOL 1000 MG/10ML IJ SOLN: 500.0000 mg | Freq: Four times a day (QID) | INTRAVENOUS | Status: DC | PRN

## 2019-08-03 MED ORDER — ONDANSETRON HCL 4 MG PO TABS: 4.0000 mg | ORAL_TABLET | Freq: Four times a day (QID) | ORAL | Status: DC | PRN

## 2019-08-03 MED ORDER — PROPOFOL 10 MG/ML IV BOLUS
INTRAVENOUS | Status: DC | PRN
  Administered 2019-08-03: 200 mg via INTRAVENOUS

## 2019-08-03 MED ORDER — ACETAMINOPHEN 500 MG PO TABS
ORAL_TABLET | ORAL | Status: AC
  Administered 2019-08-03: 1000 mg via ORAL
  Filled 2019-08-03: qty 2

## 2019-08-03 MED ORDER — HYDROMORPHONE HCL 1 MG/ML IJ SOLN
INTRAMUSCULAR | Status: AC
  Filled 2019-08-03: qty 1

## 2019-08-03 MED ORDER — SENNA 8.6 MG PO TABS
1.0000 | ORAL_TABLET | Freq: Two times a day (BID) | ORAL | Status: DC
  Administered 2019-08-03 – 2019-08-07 (×6): 8.6 mg via ORAL
  Filled 2019-08-03 (×6): qty 1

## 2019-08-03 MED ORDER — SUCCINYLCHOLINE CHLORIDE 20 MG/ML IJ SOLN
INTRAMUSCULAR | Status: DC | PRN
  Administered 2019-08-03: 120 mg via INTRAVENOUS

## 2019-08-03 MED ORDER — ROCURONIUM BROMIDE 50 MG/5ML IV SOSY
PREFILLED_SYRINGE | INTRAVENOUS | Status: DC | PRN
  Administered 2019-08-03: 40 mg via INTRAVENOUS

## 2019-08-03 MED ORDER — POVIDONE-IODINE 10 % EX SWAB: 2.0000 "application " | Freq: Once | CUTANEOUS | Status: DC

## 2019-08-03 MED ORDER — METHOCARBAMOL 1000 MG/10ML IJ SOLN
500.0000 mg | Freq: Four times a day (QID) | INTRAVENOUS | Status: DC | PRN
  Administered 2019-08-03 – 2019-08-04 (×2): 500 mg via INTRAVENOUS
  Filled 2019-08-03: qty 5
  Filled 2019-08-03: qty 500
  Filled 2019-08-03: qty 5

## 2019-08-03 MED ORDER — ACETAMINOPHEN 500 MG PO TABS: 1000.0000 mg | ORAL_TABLET | Freq: Once | ORAL | Status: AC

## 2019-08-03 MED ORDER — DOCUSATE SODIUM 100 MG PO CAPS
100.0000 mg | ORAL_CAPSULE | Freq: Two times a day (BID) | ORAL | Status: DC
  Administered 2019-08-03 – 2019-08-07 (×7): 100 mg via ORAL
  Filled 2019-08-03 (×7): qty 1

## 2019-08-03 MED ORDER — ENOXAPARIN SODIUM 40 MG/0.4ML ~~LOC~~ SOLN: 40.0000 mg | SUBCUTANEOUS | Status: DC

## 2019-08-03 MED ORDER — DIPHENHYDRAMINE HCL 12.5 MG/5ML PO ELIX: 12.5000 mg | ORAL_SOLUTION | ORAL | Status: DC | PRN

## 2019-08-03 MED ORDER — LACTATED RINGERS IV SOLN: INTRAVENOUS | Status: DC | PRN

## 2019-08-03 MED ORDER — HYDROMORPHONE HCL 1 MG/ML IJ SOLN
1.0000 mg | Freq: Once | INTRAMUSCULAR | Status: AC
  Administered 2019-08-03: 1 mg via INTRAVENOUS
  Filled 2019-08-03: qty 1

## 2019-08-03 MED ORDER — METHOCARBAMOL 500 MG PO TABS: 500.0000 mg | ORAL_TABLET | Freq: Four times a day (QID) | ORAL | Status: DC | PRN

## 2019-08-03 MED ORDER — ONDANSETRON HCL 4 MG/2ML IJ SOLN: 4.0000 mg | Freq: Four times a day (QID) | INTRAMUSCULAR | Status: DC | PRN

## 2019-08-03 MED ORDER — MIDAZOLAM HCL 5 MG/5ML IJ SOLN
INTRAMUSCULAR | Status: DC | PRN
  Administered 2019-08-03: 2 mg via INTRAVENOUS

## 2019-08-03 MED ORDER — ONDANSETRON HCL 4 MG/2ML IJ SOLN
INTRAMUSCULAR | Status: DC | PRN
  Administered 2019-08-03: 4 mg via INTRAVENOUS

## 2019-08-03 MED ORDER — CELECOXIB 200 MG PO CAPS
ORAL_CAPSULE | ORAL | Status: AC
  Administered 2019-08-03: 200 mg via ORAL
  Filled 2019-08-03: qty 1

## 2019-08-03 MED ORDER — SODIUM CHLORIDE 0.9 % IV SOLN: INTRAVENOUS | Status: DC

## 2019-08-03 MED ORDER — MEPERIDINE HCL 25 MG/ML IJ SOLN: 6.2500 mg | INTRAMUSCULAR | Status: DC | PRN

## 2019-08-03 MED ORDER — LACTATED RINGERS IV SOLN: INTRAVENOUS | Status: DC

## 2019-08-03 MED ORDER — CEFAZOLIN SODIUM-DEXTROSE 2-4 GM/100ML-% IV SOLN
2.0000 g | Freq: Once | INTRAVENOUS | Status: AC
  Administered 2019-08-03: 2 g via INTRAVENOUS

## 2019-08-03 MED ORDER — BACITRACIN ZINC 500 UNIT/GM EX OINT
TOPICAL_OINTMENT | CUTANEOUS | Status: AC
  Filled 2019-08-03: qty 28.35

## 2019-08-03 MED ORDER — CELECOXIB 200 MG PO CAPS: 200.0000 mg | ORAL_CAPSULE | Freq: Once | ORAL | Status: AC

## 2019-08-03 MED ORDER — 0.9 % SODIUM CHLORIDE (POUR BTL) OPTIME
TOPICAL | Status: DC | PRN
  Administered 2019-08-03: 18:00:00 1000 mL

## 2019-08-03 MED ORDER — METHOCARBAMOL 500 MG PO TABS
500.0000 mg | ORAL_TABLET | Freq: Four times a day (QID) | ORAL | Status: DC | PRN
  Administered 2019-08-04 – 2019-08-06 (×5): 500 mg via ORAL
  Filled 2019-08-03 (×6): qty 1

## 2019-08-03 MED ORDER — ACETAMINOPHEN 325 MG PO TABS: 325.0000 mg | ORAL_TABLET | Freq: Four times a day (QID) | ORAL | Status: DC | PRN

## 2019-08-03 MED ORDER — HYDROMORPHONE HCL 1 MG/ML IJ SOLN
0.5000 mg | INTRAMUSCULAR | Status: DC | PRN
  Administered 2019-08-04 (×2): 1 mg via INTRAVENOUS
  Filled 2019-08-03 (×2): qty 1

## 2019-08-03 MED ORDER — MIDAZOLAM HCL 2 MG/2ML IJ SOLN
INTRAMUSCULAR | Status: AC
  Filled 2019-08-03: qty 2

## 2019-08-03 MED ORDER — LIDOCAINE HCL (CARDIAC) PF 100 MG/5ML IV SOSY
PREFILLED_SYRINGE | INTRAVENOUS | Status: DC | PRN
  Administered 2019-08-03: 50 mg via INTRAVENOUS

## 2019-08-03 MED ORDER — PROMETHAZINE HCL 25 MG/ML IJ SOLN: 6.2500 mg | INTRAMUSCULAR | Status: DC | PRN

## 2019-08-03 MED ORDER — MORPHINE SULFATE (PF) 2 MG/ML IV SOLN
2.0000 mg | INTRAVENOUS | Status: DC | PRN
  Administered 2019-08-03 – 2019-08-05 (×4): 2 mg via INTRAVENOUS
  Filled 2019-08-03 (×4): qty 1

## 2019-08-03 MED ORDER — FENTANYL CITRATE (PF) 100 MCG/2ML IJ SOLN
INTRAMUSCULAR | Status: DC | PRN
  Administered 2019-08-03: 100 ug via INTRAVENOUS
  Administered 2019-08-03: 150 ug via INTRAVENOUS

## 2019-08-03 MED ORDER — CHLORHEXIDINE GLUCONATE 4 % EX LIQD: 60.0000 mL | Freq: Once | CUTANEOUS | Status: DC

## 2019-08-03 MED ORDER — SUGAMMADEX SODIUM 200 MG/2ML IV SOLN
INTRAVENOUS | Status: DC | PRN
  Administered 2019-08-03: 200 mg via INTRAVENOUS

## 2019-08-03 MED ORDER — OXYCODONE HCL 5 MG PO TABS: 5.0000 mg | ORAL_TABLET | ORAL | Status: DC | PRN

## 2019-08-03 MED ORDER — OXYCODONE HCL 5 MG PO TABS
10.0000 mg | ORAL_TABLET | ORAL | Status: DC | PRN
  Administered 2019-08-03: 15 mg via ORAL
  Administered 2019-08-04: 10 mg via ORAL
  Administered 2019-08-04: 15 mg via ORAL
  Administered 2019-08-05 – 2019-08-07 (×3): 10 mg via ORAL
  Filled 2019-08-03 (×2): qty 2
  Filled 2019-08-03 (×3): qty 3

## 2019-08-03 SURGICAL SUPPLY — 56 items
BAR GLASS FIBER EXFX 11X500 (EXFIX) ×2 IMPLANT
BIT DRILL CANN MED FLUTE 4.0 (BIT) IMPLANT
BLADE SURG 10 STRL SS (BLADE) ×3 IMPLANT
BNDG COHESIVE 4X5 TAN STRL (GAUZE/BANDAGES/DRESSINGS) ×3 IMPLANT
BNDG COHESIVE 6X5 TAN STRL LF (GAUZE/BANDAGES/DRESSINGS) ×5 IMPLANT
BNDG GAUZE ELAST 4 BULKY (GAUZE/BANDAGES/DRESSINGS) ×4 IMPLANT
Biomet - Half Pin ×4 IMPLANT
CANISTER SUCT 3000ML PPV (MISCELLANEOUS) ×3 IMPLANT
CHLORAPREP W/TINT 26 (MISCELLANEOUS) ×3 IMPLANT
COVER SURGICAL LIGHT HANDLE (MISCELLANEOUS) ×3 IMPLANT
COVER WAND RF STERILE (DRAPES) ×3 IMPLANT
CUFF TOURN SGL QUICK 34 (TOURNIQUET CUFF) ×3
CUFF TOURN SGL QUICK 42 (TOURNIQUET CUFF) IMPLANT
CUFF TRNQT CYL 34X4.125X (TOURNIQUET CUFF) ×1 IMPLANT
DRAPE C-ARM 42X72 X-RAY (DRAPES) ×3 IMPLANT
DRAPE U-SHAPE 47X51 STRL (DRAPES) ×3 IMPLANT
DRILL CANN 4.0MM (BIT) ×3
DRSG ADAPTIC 3X8 NADH LF (GAUZE/BANDAGES/DRESSINGS) IMPLANT
DRSG PAD ABDOMINAL 8X10 ST (GAUZE/BANDAGES/DRESSINGS) IMPLANT
ELECT REM PT RETURN 9FT ADLT (ELECTROSURGICAL) ×3
ELECTRODE REM PT RTRN 9FT ADLT (ELECTROSURGICAL) ×1 IMPLANT
GAUZE SPONGE 4X4 12PLY STRL (GAUZE/BANDAGES/DRESSINGS) IMPLANT
GAUZE XEROFORM 5X9 LF (GAUZE/BANDAGES/DRESSINGS) ×2 IMPLANT
GLOVE BIO SURGEON STRL SZ8 (GLOVE) ×3 IMPLANT
GLOVE BIOGEL PI IND STRL 8 (GLOVE) ×2 IMPLANT
GLOVE BIOGEL PI INDICATOR 8 (GLOVE) ×4
GLOVE ECLIPSE 8.0 STRL XLNG CF (GLOVE) ×3 IMPLANT
GOWN STRL REUS W/ TWL LRG LVL3 (GOWN DISPOSABLE) ×1 IMPLANT
GOWN STRL REUS W/ TWL XL LVL3 (GOWN DISPOSABLE) ×2 IMPLANT
GOWN STRL REUS W/TWL LRG LVL3 (GOWN DISPOSABLE) ×2
GOWN STRL REUS W/TWL XL LVL3 (GOWN DISPOSABLE) ×6
HALF PIN 5.0X160 (EXFIX) ×8 IMPLANT
KIT BASIN OR (CUSTOM PROCEDURE TRAY) ×3 IMPLANT
KIT TURNOVER KIT B (KITS) ×3 IMPLANT
NEEDLE 22X1 1/2 (OR ONLY) (NEEDLE) IMPLANT
NS IRRIG 1000ML POUR BTL (IV SOLUTION) ×3 IMPLANT
PACK ORTHO EXTREMITY (CUSTOM PROCEDURE TRAY) ×3 IMPLANT
PAD ARMBOARD 7.5X6 YLW CONV (MISCELLANEOUS) ×6 IMPLANT
PAD CAST 4YDX4 CTTN HI CHSV (CAST SUPPLIES) ×1 IMPLANT
PADDING CAST COTTON 4X4 STRL (CAST SUPPLIES) ×3
PIN CLAMP 2BAR 75MM BLUE (EXFIX) ×4 IMPLANT
SOAP 2 % CHG 4 OZ (WOUND CARE) ×3 IMPLANT
STAPLER VISISTAT 35W (STAPLE) IMPLANT
STOCKINETTE IMPERVIOUS LG (DRAPES) ×2 IMPLANT
SUCTION FRAZIER HANDLE 10FR (MISCELLANEOUS)
SUCTION TUBE FRAZIER 10FR DISP (MISCELLANEOUS) IMPLANT
SUT PROLENE 3 0 PS 2 (SUTURE) ×3 IMPLANT
SUT VIC AB 3-0 PS2 18 (SUTURE) ×2
SUT VIC AB 3-0 PS2 18XBRD (SUTURE) ×1 IMPLANT
SYR CONTROL 10ML LL (SYRINGE) IMPLANT
TOWEL GREEN STERILE (TOWEL DISPOSABLE) ×3 IMPLANT
TOWEL GREEN STERILE FF (TOWEL DISPOSABLE) ×3 IMPLANT
TUBE CONNECTING 12'X1/4 (SUCTIONS) ×1
TUBE CONNECTING 12X1/4 (SUCTIONS) ×1 IMPLANT
WATER STERILE IRR 1000ML POUR (IV SOLUTION) ×3 IMPLANT
Xtra  fix bar ×4 IMPLANT

## 2019-08-03 NOTE — Transfer of Care (Signed)
Immediate Anesthesia Transfer of Care Note  Patient: Jeffery Cooley  Procedure(s) Performed: EXTERNAL FIXATION LEG (Left Leg Lower)  Patient Location: PACU  Anesthesia Type:General  Level of Consciousness: awake, alert  and oriented  Airway & Oxygen Therapy: Patient Spontanous Breathing  Post-op Assessment: Report given to RN and Post -op Vital signs reviewed and stable  Post vital signs: Reviewed and stable  Last Vitals:  Vitals Value Taken Time  BP    Temp    Pulse 104 08/03/19 1826  Resp 17 08/03/19 1826  SpO2 77 % 08/03/19 1826  Vitals shown include unvalidated device data.  Last Pain:  Vitals:   08/03/19 1605  TempSrc: Oral  PainSc:          Complications: No apparent anesthesia complications

## 2019-08-03 NOTE — ED Notes (Signed)
Patient's belongings placed in bag with a patient label. One shoe, one jacket, one  helmet, one pair of goggles, three yellow necklaces, three yellow earrings and a nose ring.

## 2019-08-03 NOTE — Anesthesia Preprocedure Evaluation (Addendum)
Anesthesia Evaluation  Patient identified by MRN, date of birth, ID band Patient awake    Reviewed: Allergy & Precautions, H&P , NPO status , Patient's Chart, lab work & pertinent test results  Airway Mallampati: I  TM Distance: >3 FB Neck ROM: Full    Dental no notable dental hx. (+) Teeth Intact, Dental Advisory Given   Pulmonary neg pulmonary ROS, former smoker,    Pulmonary exam normal breath sounds clear to auscultation       Cardiovascular negative cardio ROS   Rhythm:Regular Rate:Normal     Neuro/Psych negative neurological ROS  negative psych ROS   GI/Hepatic negative GI ROS, Neg liver ROS,   Endo/Other  negative endocrine ROS  Renal/GU negative Renal ROS  negative genitourinary   Musculoskeletal   Abdominal   Peds  Hematology negative hematology ROS (+)   Anesthesia Other Findings   Reproductive/Obstetrics negative OB ROS                            Anesthesia Physical Anesthesia Plan  ASA: I  Anesthesia Plan: General   Post-op Pain Management:    Induction: Intravenous, Rapid sequence and Cricoid pressure planned  PONV Risk Score and Plan: 3 and Ondansetron, Dexamethasone and Midazolam  Airway Management Planned: Oral ETT  Additional Equipment:   Intra-op Plan:   Post-operative Plan: Extubation in OR  Informed Consent: I have reviewed the patients History and Physical, chart, labs and discussed the procedure including the risks, benefits and alternatives for the proposed anesthesia with the patient or authorized representative who has indicated his/her understanding and acceptance.     Dental advisory given  Plan Discussed with: CRNA and Surgeon  Anesthesia Plan Comments:        Anesthesia Quick Evaluation

## 2019-08-03 NOTE — Anesthesia Procedure Notes (Signed)
Procedure Name: Intubation Date/Time: 08/03/2019 5:40 PM Performed by: Eligha Bridegroom, CRNA Pre-anesthesia Checklist: Emergency Drugs available, Suction available, Patient being monitored and Patient identified Patient Re-evaluated:Patient Re-evaluated prior to induction Oxygen Delivery Method: Circle system utilized Preoxygenation: Pre-oxygenation with 100% oxygen Induction Type: IV induction, Rapid sequence and Cricoid Pressure applied Laryngoscope Size: Mac and 4 Tube type: Oral Tube size: 8.0 mm Number of attempts: 1 Airway Equipment and Method: Stylet Placement Confirmation: ETT inserted through vocal cords under direct vision,  positive ETCO2 and breath sounds checked- equal and bilateral Secured at: 23 cm Tube secured with: Tape Dental Injury: Teeth and Oropharynx as per pre-operative assessment
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
## 2019-08-03 NOTE — ED Notes (Signed)
Pt returned from XR at this time. Placed pt back on monitor, pt placed in position of comfort and advised of wait status.   
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
## 2019-08-03 NOTE — ED Notes (Signed)
Pt transported to CT via stretcher at this time.  

## 2019-08-03 NOTE — Anesthesia Postprocedure Evaluation (Signed)
Anesthesia Post Note  Patient: Jeffery Cooley  Procedure(s) Performed: EXTERNAL FIXATION LEG (Left Leg Lower)     Patient location during evaluation: PACU Anesthesia Type: General Level of consciousness: sedated and patient cooperative Pain management: pain level controlled Vital Signs Assessment: post-procedure vital signs reviewed and stable Respiratory status: spontaneous breathing Cardiovascular status: stable Anesthetic complications: no    Last Vitals:  Vitals:   08/03/19 1930 08/03/19 1952  BP:  127/78  Pulse: 88 90  Resp: 18 15  Temp: 36.9 C 37 C  SpO2: 97% 100%    Last Pain:  Vitals:   08/03/19 1952  TempSrc: Oral  PainSc:                  Lewie Loron
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
## 2019-08-03 NOTE — Progress Notes (Signed)
Pt arrived to unit from PACU via bed.  Pt is A&O x 4 in no acute distress.  LLE warm, cap refill <3, able to feel and move toes.  Pt oriented to room and unit.  Fed dinner and admitted.
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
## 2019-08-04 ENCOUNTER — Inpatient Hospital Stay (HOSPITAL_COMMUNITY): Payer: No Typology Code available for payment source

## 2019-08-04 ENCOUNTER — Encounter (HOSPITAL_COMMUNITY): Payer: Self-pay | Admitting: Orthopedic Surgery

## 2019-08-04 ENCOUNTER — Other Ambulatory Visit: Payer: Self-pay

## 2019-08-04 ENCOUNTER — Encounter (HOSPITAL_COMMUNITY)
Admission: EM | Disposition: A | Discharge status: Home Health | Payer: Self-pay | Source: Home / Self Care | Attending: Student

## 2019-08-04 ENCOUNTER — Inpatient Hospital Stay (HOSPITAL_COMMUNITY): Payer: No Typology Code available for payment source | Admitting: Critical Care Medicine

## 2019-08-04 HISTORY — PX: EXTERNAL FIXATION LEG: SHX1549

## 2019-08-04 LAB — SURGICAL PCR SCREEN
MRSA, PCR: NEGATIVE
Staphylococcus aureus: NEGATIVE

## 2019-08-04 SURGERY — EXTERNAL FIXATION, LOWER EXTREMITY
Anesthesia: General | Site: Leg Upper | Laterality: Left

## 2019-08-04 MED ORDER — MIDAZOLAM HCL 5 MG/5ML IJ SOLN
INTRAMUSCULAR | Status: DC | PRN
  Administered 2019-08-04: 2 mg via INTRAVENOUS

## 2019-08-04 MED ORDER — PROMETHAZINE HCL 25 MG/ML IJ SOLN: 6.2500 mg | INTRAMUSCULAR | Status: DC | PRN

## 2019-08-04 MED ORDER — ONDANSETRON HCL 4 MG/2ML IJ SOLN
INTRAMUSCULAR | Status: DC | PRN
  Administered 2019-08-04: 4 mg via INTRAVENOUS

## 2019-08-04 MED ORDER — KETOROLAC TROMETHAMINE 30 MG/ML IJ SOLN: 30.0000 mg | Freq: Once | INTRAMUSCULAR | Status: DC

## 2019-08-04 MED ORDER — OXYCODONE HCL 5 MG PO TABS: 5.0000 mg | ORAL_TABLET | Freq: Once | ORAL | Status: DC | PRN

## 2019-08-04 MED ORDER — 0.9 % SODIUM CHLORIDE (POUR BTL) OPTIME
TOPICAL | Status: DC | PRN
  Administered 2019-08-04: 1000 mL

## 2019-08-04 MED ORDER — HYDROMORPHONE HCL 1 MG/ML IJ SOLN
0.2500 mg | INTRAMUSCULAR | Status: DC | PRN
  Administered 2019-08-04 (×4): 0.5 mg via INTRAVENOUS

## 2019-08-04 MED ORDER — FENTANYL CITRATE (PF) 100 MCG/2ML IJ SOLN
100.0000 ug | Freq: Once | INTRAMUSCULAR | Status: AC
  Administered 2019-08-04: 100 ug via INTRAVENOUS

## 2019-08-04 MED ORDER — METHOCARBAMOL 500 MG PO TABS
ORAL_TABLET | ORAL | Status: AC
  Filled 2019-08-04: qty 1

## 2019-08-04 MED ORDER — ACETAMINOPHEN 500 MG PO TABS
1000.0000 mg | ORAL_TABLET | Freq: Four times a day (QID) | ORAL | Status: DC
  Administered 2019-08-04 – 2019-08-07 (×10): 1000 mg via ORAL
  Filled 2019-08-04 (×11): qty 2

## 2019-08-04 MED ORDER — PROPOFOL 10 MG/ML IV BOLUS
INTRAVENOUS | Status: AC
  Filled 2019-08-04: qty 20

## 2019-08-04 MED ORDER — DEXAMETHASONE SODIUM PHOSPHATE 10 MG/ML IJ SOLN
INTRAMUSCULAR | Status: DC | PRN
  Administered 2019-08-04: 4 mg via INTRAVENOUS

## 2019-08-04 MED ORDER — KETOROLAC TROMETHAMINE 15 MG/ML IJ SOLN
15.0000 mg | Freq: Once | INTRAMUSCULAR | Status: AC
  Administered 2019-08-04: 15 mg via INTRAVENOUS
  Filled 2019-08-04: qty 1

## 2019-08-04 MED ORDER — HYDROMORPHONE HCL 1 MG/ML IJ SOLN: 1.0000 mg | INTRAMUSCULAR | Status: DC | PRN

## 2019-08-04 MED ORDER — ENOXAPARIN SODIUM 40 MG/0.4ML ~~LOC~~ SOLN
40.0000 mg | SUBCUTANEOUS | Status: DC
  Administered 2019-08-05 – 2019-08-07 (×3): 40 mg via SUBCUTANEOUS
  Filled 2019-08-04 (×3): qty 0.4

## 2019-08-04 MED ORDER — KETOROLAC TROMETHAMINE 15 MG/ML IJ SOLN
15.0000 mg | Freq: Four times a day (QID) | INTRAMUSCULAR | Status: AC
  Administered 2019-08-04 – 2019-08-05 (×5): 15 mg via INTRAVENOUS
  Filled 2019-08-04 (×5): qty 1

## 2019-08-04 MED ORDER — ACETAMINOPHEN 10 MG/ML IV SOLN
INTRAVENOUS | Status: AC
  Filled 2019-08-04: qty 100

## 2019-08-04 MED ORDER — ACETAMINOPHEN 10 MG/ML IV SOLN
1000.0000 mg | Freq: Once | INTRAVENOUS | Status: DC | PRN
  Administered 2019-08-04: 1000 mg via INTRAVENOUS

## 2019-08-04 MED ORDER — HYDROMORPHONE HCL 1 MG/ML IJ SOLN
INTRAMUSCULAR | Status: AC
  Filled 2019-08-04: qty 1

## 2019-08-04 MED ORDER — MUPIROCIN 2 % EX OINT: 1.0000 "application " | TOPICAL_OINTMENT | Freq: Two times a day (BID) | CUTANEOUS | Status: DC

## 2019-08-04 MED ORDER — FENTANYL CITRATE (PF) 250 MCG/5ML IJ SOLN
INTRAMUSCULAR | Status: DC | PRN
  Administered 2019-08-04: 50 ug via INTRAVENOUS
  Administered 2019-08-04 (×8): 25 ug via INTRAVENOUS

## 2019-08-04 MED ORDER — MIDAZOLAM HCL 2 MG/2ML IJ SOLN
INTRAMUSCULAR | Status: AC
  Filled 2019-08-04: qty 2

## 2019-08-04 MED ORDER — FENTANYL CITRATE (PF) 250 MCG/5ML IJ SOLN
INTRAMUSCULAR | Status: AC
  Filled 2019-08-04: qty 5

## 2019-08-04 MED ORDER — LIDOCAINE 2% (20 MG/ML) 5 ML SYRINGE
INTRAMUSCULAR | Status: DC | PRN
  Administered 2019-08-04: 60 mg via INTRAVENOUS

## 2019-08-04 MED ORDER — OXYCODONE HCL 5 MG/5ML PO SOLN: 5.0000 mg | Freq: Once | ORAL | Status: DC | PRN

## 2019-08-04 MED ORDER — PROPOFOL 10 MG/ML IV BOLUS
INTRAVENOUS | Status: DC | PRN
  Administered 2019-08-04: 10 mg via INTRAVENOUS
  Administered 2019-08-04: 200 mg via INTRAVENOUS
  Administered 2019-08-04 (×2): 10 mg via INTRAVENOUS

## 2019-08-04 MED ORDER — KETOROLAC TROMETHAMINE 15 MG/ML IJ SOLN
15.0000 mg | Freq: Four times a day (QID) | INTRAMUSCULAR | Status: DC
  Administered 2019-08-04: 15 mg via INTRAVENOUS
  Filled 2019-08-04: qty 1

## 2019-08-04 MED ORDER — FENTANYL CITRATE (PF) 100 MCG/2ML IJ SOLN
INTRAMUSCULAR | Status: AC
  Filled 2019-08-04: qty 2

## 2019-08-04 SURGICAL SUPPLY — 47 items
BNDG COHESIVE 4X5 TAN STRL (GAUZE/BANDAGES/DRESSINGS) ×3 IMPLANT
BNDG ELASTIC 4X5.8 VLCR STR LF (GAUZE/BANDAGES/DRESSINGS) ×3 IMPLANT
BNDG ELASTIC 6X5.8 VLCR STR LF (GAUZE/BANDAGES/DRESSINGS) ×3 IMPLANT
BNDG GAUZE ELAST 4 BULKY (GAUZE/BANDAGES/DRESSINGS) ×4 IMPLANT
BRUSH SCRUB EZ PLAIN DRY (MISCELLANEOUS) ×6 IMPLANT
CHLORAPREP W/TINT 26 (MISCELLANEOUS) ×3 IMPLANT
CLOSURE WOUND 1/2 X4 (GAUZE/BANDAGES/DRESSINGS)
COVER SURGICAL LIGHT HANDLE (MISCELLANEOUS) ×6 IMPLANT
COVER WAND RF STERILE (DRAPES) ×3 IMPLANT
DRAPE C-ARM 42X72 X-RAY (DRAPES) IMPLANT
DRAPE C-ARMOR (DRAPES) ×3 IMPLANT
DRAPE IMP U-DRAPE 54X76 (DRAPES) ×6 IMPLANT
DRAPE ORTHO SPLIT 77X108 STRL (DRAPES) ×6
DRAPE SURG ORHT 6 SPLT 77X108 (DRAPES) ×2 IMPLANT
DRAPE U-SHAPE 47X51 STRL (DRAPES) ×3 IMPLANT
DRSG MEPITEL 4X7.2 (GAUZE/BANDAGES/DRESSINGS) IMPLANT
ELECT REM PT RETURN 9FT ADLT (ELECTROSURGICAL) ×3
ELECTRODE REM PT RTRN 9FT ADLT (ELECTROSURGICAL) ×1 IMPLANT
GAUZE SPONGE 4X4 12PLY STRL (GAUZE/BANDAGES/DRESSINGS) ×3 IMPLANT
GLOVE BIO SURGEON STRL SZ 6.5 (GLOVE) ×6 IMPLANT
GLOVE BIO SURGEON STRL SZ7.5 (GLOVE) ×12 IMPLANT
GLOVE BIO SURGEONS STRL SZ 6.5 (GLOVE) ×3
GLOVE BIOGEL PI IND STRL 6.5 (GLOVE) ×1 IMPLANT
GLOVE BIOGEL PI IND STRL 7.5 (GLOVE) ×1 IMPLANT
GLOVE BIOGEL PI INDICATOR 6.5 (GLOVE) ×2
GLOVE BIOGEL PI INDICATOR 7.5 (GLOVE) ×2
GOWN STRL REUS W/ TWL LRG LVL3 (GOWN DISPOSABLE) ×2 IMPLANT
GOWN STRL REUS W/TWL LRG LVL3 (GOWN DISPOSABLE) ×6
KIT BASIN OR (CUSTOM PROCEDURE TRAY) ×3 IMPLANT
KIT TURNOVER KIT B (KITS) ×3 IMPLANT
MANIFOLD NEPTUNE II (INSTRUMENTS) ×3 IMPLANT
NEEDLE 22X1 1/2 (OR ONLY) (NEEDLE) IMPLANT
NS IRRIG 1000ML POUR BTL (IV SOLUTION) ×3 IMPLANT
PACK GENERAL/GYN (CUSTOM PROCEDURE TRAY) ×2 IMPLANT
PACK ORTHO EXTREMITY (CUSTOM PROCEDURE TRAY) ×1 IMPLANT
PAD ARMBOARD 7.5X6 YLW CONV (MISCELLANEOUS) ×6 IMPLANT
PAD CAST 4YDX4 CTTN HI CHSV (CAST SUPPLIES) IMPLANT
PADDING CAST COTTON 4X4 STRL (CAST SUPPLIES) ×3
PADDING CAST COTTON 6X4 STRL (CAST SUPPLIES) ×4 IMPLANT
SPONGE LAP 18X18 RF (DISPOSABLE) ×3 IMPLANT
STAPLER VISISTAT 35W (STAPLE) ×3 IMPLANT
STRIP CLOSURE SKIN 1/2X4 (GAUZE/BANDAGES/DRESSINGS) IMPLANT
SUT ETHILON 3 0 PS 1 (SUTURE) ×2 IMPLANT
SUT PROLENE 0 CT (SUTURE) IMPLANT
TOWEL GREEN STERILE (TOWEL DISPOSABLE) ×6 IMPLANT
TOWEL GREEN STERILE FF (TOWEL DISPOSABLE) ×6 IMPLANT
UNDERPAD 30X30 (UNDERPADS AND DIAPERS) ×3 IMPLANT

## 2019-08-04 NOTE — Anesthesia Postprocedure Evaluation (Signed)
Anesthesia Post Note  Patient: Jeffery Cooley  Procedure(s) Performed: ADJUSTMENT OF EXTERNAL FIXATION (Left Leg Upper)     Patient location during evaluation: PACU Anesthesia Type: General Level of consciousness: awake and alert Pain management: pain level controlled Vital Signs Assessment: post-procedure vital signs reviewed and stable Respiratory status: spontaneous breathing, nonlabored ventilation, respiratory function stable and patient connected to nasal cannula oxygen Cardiovascular status: blood pressure returned to baseline and stable Postop Assessment: no apparent nausea or vomiting Anesthetic complications: no    Last Vitals:  Vitals:   08/04/19 1320 08/04/19 1346  BP:  131/78  Pulse:  88  Resp:  15  Temp: 37.3 C 36.8 C  SpO2:  97%    Last Pain:  Vitals:   08/04/19 1346  TempSrc: Oral  PainSc: 5                  Ryan P Ellender

## 2019-08-04 NOTE — Plan of Care (Signed)
  Problem: Education: Goal: Knowledge of General Education information will improve Description: Including pain rating scale, medication(s)/side effects and non-pharmacologic comfort measures Outcome: Progressing   Problem: Clinical Measurements: Goal: Respiratory complications will improve Outcome: Progressing Note: On room air   Problem: Nutrition: Goal: Adequate nutrition will be maintained Outcome: Progressing   Problem: Coping: Goal: Level of anxiety will decrease Outcome: Progressing   Problem: Elimination: Goal: Will not experience complications related to urinary retention Outcome: Progressing   Problem: Pain Managment: Goal: General experience of comfort will improve Outcome: Progressing Note: Pain in left leg much better controlled after surgery , just scheduled pain medication given   Problem: Safety: Goal: Ability to remain free from injury will improve Outcome: Progressing   Problem: Education: Goal: Knowledge of the prescribed therapeutic regimen will improve Outcome: Progressing   Problem: Pain Management: Goal: Pain level will decrease with appropriate interventions Outcome: Progressing
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
## 2019-08-04 NOTE — Anesthesia Preprocedure Evaluation (Addendum)
Anesthesia Evaluation  Patient identified by MRN, date of birth, ID band Patient awake    Reviewed: Allergy & Precautions, NPO status , Patient's Chart, lab work & pertinent test results  Airway Mallampati: II  TM Distance: >3 FB Neck ROM: Full    Dental no notable dental hx.    Pulmonary Current Smoker and Patient abstained from smoking.,    Pulmonary exam normal breath sounds clear to auscultation       Cardiovascular negative cardio ROS Normal cardiovascular exam Rhythm:Regular Rate:Normal     Neuro/Psych negative neurological ROS  negative psych ROS   GI/Hepatic negative GI ROS, Neg liver ROS,   Endo/Other  negative endocrine ROS  Renal/GU negative Renal ROS     Musculoskeletal negative musculoskeletal ROS (+)   Abdominal (+) + obese,   Peds  Hematology negative hematology ROS (+)   Anesthesia Other Findings Left tibia plateau fracture  Reproductive/Obstetrics                            Anesthesia Physical Anesthesia Plan  ASA: II  Anesthesia Plan: General   Post-op Pain Management:    Induction: Intravenous  PONV Risk Score and Plan: 1 and Dexamethasone, Ondansetron, Midazolam and Treatment may vary due to age or medical condition  Airway Management Planned: LMA  Additional Equipment:   Intra-op Plan:   Post-operative Plan: Extubation in OR  Informed Consent: I have reviewed the patients History and Physical, chart, labs and discussed the procedure including the risks, benefits and alternatives for the proposed anesthesia with the patient or authorized representative who has indicated his/her understanding and acceptance.     Dental advisory given  Plan Discussed with: CRNA  Anesthesia Plan Comments:        Anesthesia Quick Evaluation

## 2019-08-04 NOTE — Transfer of Care (Signed)
Immediate Anesthesia Transfer of Care Note  Patient: Jeffery Cooley  Procedure(s) Performed: ADJUSTMENT OF EXTERNAL FIXATION (Left Leg Upper)  Patient Location: PACU  Anesthesia Type:General  Level of Consciousness: awake and alert   Airway & Oxygen Therapy: Patient Spontanous Breathing and Patient connected to nasal cannula oxygen  Post-op Assessment: Report given to RN and Post -op Vital signs reviewed and stable  Post vital signs: Reviewed and stable  Last Vitals:  Vitals Value Taken Time  BP    Temp    Pulse 113 08/04/19 1205  Resp    SpO2 97 % 08/04/19 1205  Vitals shown include unvalidated device data.  Last Pain:  Vitals:   08/04/19 0940  TempSrc:   PainSc: 7       Patients Stated Pain Goal: 5 (08/04/19 0910)  Complications: No apparent anesthesia complications
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
## 2019-08-04 NOTE — Anesthesia Procedure Notes (Signed)
Procedure Name: LMA Insertion Date/Time: 08/04/2019 11:10 AM Performed by: Rachel Moulds, CRNA Pre-anesthesia Checklist: Patient identified, Emergency Drugs available, Suction available, Patient being monitored and Timeout performed Patient Re-evaluated:Patient Re-evaluated prior to induction Oxygen Delivery Method: Circle system utilized Preoxygenation: Pre-oxygenation with 100% oxygen Induction Type: IV induction LMA: LMA inserted LMA Size: 5.0 Number of attempts: 1 Placement Confirmation: positive ETCO2 and breath sounds checked- equal and bilateral Tube secured with: Tape Dental Injury: Teeth and Oropharynx as per pre-operative assessment

## 2019-08-04 NOTE — Progress Notes (Signed)
PT Cancellation Note  Patient Details Name: NOLTON DENIS MRN: [id number] DOB: Jun 08, 1980   Cancelled Treatment:      Per chart review, patient returning to OR for additional surgery/possible removal of external fixator. Will follow closely and attempt to return post-op if time/schedule allows. If patient has urgent rehab needs, please direct message this therapist in Epic (8am-4pm) or call acute rehab office at number below.     Madelaine Etienne, DPT, PN1   Supplemental Physical Therapist [facility]    Pager [phone] Acute Rehab Office [phone]

## 2019-08-04 NOTE — Progress Notes (Signed)
Alcohol intake charted at 2-3 drinks/week.  On admission, pt reports he drinks "4 - 2 finger bourbons every night."  Daughter states "and refills".  History updated.  Note placed on chart.

## 2019-08-05 LAB — VITAMIN D 25 HYDROXY (VIT D DEFICIENCY, FRACTURES): Vit D, 25-Hydroxy: 20.97 ng/mL — ABNORMAL LOW (ref 30–100)

## 2019-08-05 MED ORDER — VITAMIN D 25 MCG (1000 UNIT) PO TABS
2000.0000 [IU] | ORAL_TABLET | Freq: Two times a day (BID) | ORAL | Status: DC
  Administered 2019-08-05 – 2019-08-07 (×5): 2000 [IU] via ORAL
  Filled 2019-08-05 (×5): qty 2
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
## 2019-08-05 NOTE — Plan of Care (Signed)
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
## 2019-08-06 LAB — BASIC METABOLIC PANEL
Anion gap: 10 (ref 5–15)
BUN: 8 mg/dL (ref 6–20)
CO2: 25 mmol/L (ref 22–32)
Calcium: 8.4 mg/dL — ABNORMAL LOW (ref 8.9–10.3)
Chloride: 105 mmol/L (ref 98–111)
Creatinine, Ser: 0.88 mg/dL (ref 0.61–1.24)
GFR calc Af Amer: >60 mL/min (ref >60)
GFR calc non Af Amer: >60 mL/min (ref >60)
Glucose, Bld: 95 mg/dL (ref 70–99)
Potassium: 4.1 mmol/L (ref 3.5–5.1)
Sodium: 140 mmol/L (ref 135–145)

## 2019-08-06 MED ORDER — VITAMIN D 125 MCG (5000 UT) PO CAPS: 5000.0000 [IU] | ORAL_CAPSULE | Freq: Every day | ORAL | 1 refills | Status: DC

## 2019-08-06 MED ORDER — OXYCODONE-ACETAMINOPHEN 7.5-325 MG PO TABS: 1.0000 | ORAL_TABLET | ORAL | 0 refills | Status: DC | PRN

## 2019-08-06 MED ORDER — METHOCARBAMOL 750 MG PO TABS: 750.0000 mg | ORAL_TABLET | Freq: Four times a day (QID) | ORAL | 0 refills | Status: DC | PRN

## 2019-08-06 MED ORDER — ENOXAPARIN SODIUM 40 MG/0.4ML ~~LOC~~ SOLN: 40.0000 mg | SUBCUTANEOUS | 0 refills | Status: DC
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
## 2019-08-06 NOTE — Plan of Care (Signed)
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
## 2019-08-06 NOTE — Progress Notes (Signed)
Orthopedic Tech Progress Note Patient Details:  Jeffery Cooley 1980-10-03 [id number]  Ortho Devices Type of Ortho Device: Crutches Ortho Device/Splint Interventions: Adjustment   Post Interventions Patient Tolerated: Well Instructions Provided: Care of device   Saul Fordyce 08/06/2019, 4:34 PM
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
## 2019-08-06 NOTE — Plan of Care (Signed)
  Problem: Education: Goal: Knowledge of General Education information will improve Description: Including pain rating scale, medication(s)/side effects and non-pharmacologic comfort measures Outcome: Progressing   Problem: Activity: Goal: Risk for activity intolerance will decrease Outcome: Progressing   Problem: Pain Managment: Goal: General experience of comfort will improve Outcome: Progressing   
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
## 2019-08-07 ENCOUNTER — Other Ambulatory Visit: Payer: Self-pay | Admitting: Orthopedic Surgery

## 2019-08-07 MED ORDER — ENOXAPARIN SODIUM 40 MG/0.4ML ~~LOC~~ SOLN: 40.0000 mg | SUBCUTANEOUS | 0 refills | Status: DC

## 2019-08-07 MED ORDER — VITAMIN D 125 MCG (5000 UT) PO CAPS: 5000.0000 [IU] | ORAL_CAPSULE | Freq: Every day | ORAL | 1 refills | Status: DC

## 2019-08-07 MED ORDER — OXYCODONE-ACETAMINOPHEN 7.5-325 MG PO TABS: 1.0000 | ORAL_TABLET | ORAL | 0 refills | Status: DC | PRN

## 2019-08-07 MED ORDER — METHOCARBAMOL 750 MG PO TABS: 750.0000 mg | ORAL_TABLET | Freq: Four times a day (QID) | ORAL | 0 refills | Status: DC | PRN

## 2019-08-07 NOTE — Progress Notes (Signed)
Discharge summary printed and provided to pt . Explained and educated pt with no complaints. Verbalized understanding. All questions answered. Per pt, his daughter will pick him up.
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
## 2019-08-07 NOTE — Care Management (Signed)
Patient's DME has not been delivered by [employer].  Keon with [employer] says it is scheduled for delivery today and they will contact daughter.  Patient may leave when he is ready.

## 2019-08-07 NOTE — Plan of Care (Signed)
  Problem: Activity: Goal: Risk for activity intolerance will decrease Outcome: Progressing   Problem: Pain Managment: Goal: General experience of comfort will improve Outcome: Progressing   
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
## 2019-08-13 ENCOUNTER — Encounter (HOSPITAL_COMMUNITY): Payer: Self-pay | Admitting: Student

## 2019-08-13 ENCOUNTER — Other Ambulatory Visit (HOSPITAL_COMMUNITY)
Admission: RE | Admit: 2019-08-13 | Discharge: 2019-08-13 | Disposition: A | Discharge status: Home / Self Care | Payer: No Typology Code available for payment source | Source: Ambulatory Visit | Attending: Student | Admitting: Student

## 2019-08-13 ENCOUNTER — Other Ambulatory Visit: Payer: Self-pay

## 2019-08-13 DIAGNOSIS — Z20822 Contact with and (suspected) exposure to covid-19: Secondary | ICD-10-CM | POA: Insufficient documentation

## 2019-08-13 DIAGNOSIS — Z01812 Encounter for preprocedural laboratory examination: Secondary | ICD-10-CM | POA: Insufficient documentation

## 2019-08-13 LAB — SARS CORONAVIRUS 2 (TAT 6-24 HRS): SARS Coronavirus 2: NEGATIVE

## 2019-08-13 NOTE — Progress Notes (Signed)
Pt denies SOB, chest pain, and being under the care of a cardiologist. Pt stated that PCP is Dr. Benjamin Stain. Pt denies having a stress test, echo and cardiac cath. Pt denies having a chest x ray in the last year. Pt stated that he was instructed to hold Lovenox injection on DOS. Pt made aware to stop taking  Aspirin (unless otherwise advised by surgeon), Phentermine, vitamins, fish oil and herbal medications. Do not take any NSAIDs ie: Ibuprofen, Advil, Naproxen (Aleve), Motrin, BC and Goody Powder. Pt reminded to quarantine. Pt verbalized understanding of all pre-op instructions.

## 2019-08-16 ENCOUNTER — Encounter (HOSPITAL_COMMUNITY): Payer: Self-pay | Admitting: Student

## 2019-08-16 ENCOUNTER — Inpatient Hospital Stay (HOSPITAL_COMMUNITY): Payer: No Typology Code available for payment source | Admitting: Anesthesiology

## 2019-08-16 ENCOUNTER — Inpatient Hospital Stay (HOSPITAL_COMMUNITY)
Admission: RE | Admit: 2019-08-16 | Discharge: 2019-08-17 | DRG: 488 | Disposition: A | Discharge status: Home Health | Payer: No Typology Code available for payment source | Attending: Student | Admitting: Student

## 2019-08-16 ENCOUNTER — Encounter (HOSPITAL_COMMUNITY)
Admission: RE | Disposition: A | Discharge status: Home / Self Care | Payer: Self-pay | Source: Home / Self Care | Attending: Student

## 2019-08-16 ENCOUNTER — Other Ambulatory Visit: Payer: Self-pay

## 2019-08-16 ENCOUNTER — Inpatient Hospital Stay (HOSPITAL_COMMUNITY): Payer: No Typology Code available for payment source

## 2019-08-16 DIAGNOSIS — S82142A Displaced bicondylar fracture of left tibia, initial encounter for closed fracture: Secondary | ICD-10-CM | POA: Diagnosis present

## 2019-08-16 DIAGNOSIS — D62 Acute posthemorrhagic anemia: Secondary | ICD-10-CM | POA: Diagnosis not present

## 2019-08-16 DIAGNOSIS — Z7901 Long term (current) use of anticoagulants: Secondary | ICD-10-CM | POA: Diagnosis not present

## 2019-08-16 DIAGNOSIS — Z881 Allergy status to other antibiotic agents status: Secondary | ICD-10-CM | POA: Diagnosis not present

## 2019-08-16 DIAGNOSIS — Z79899 Other long term (current) drug therapy: Secondary | ICD-10-CM

## 2019-08-16 DIAGNOSIS — Z79891 Long term (current) use of opiate analgesic: Secondary | ICD-10-CM

## 2019-08-16 DIAGNOSIS — Z419 Encounter for procedure for purposes other than remedying health state, unspecified: Principal | ICD-10-CM

## 2019-08-16 DIAGNOSIS — F17290 Nicotine dependence, other tobacco product, uncomplicated: Secondary | ICD-10-CM | POA: Diagnosis present

## 2019-08-16 DIAGNOSIS — Z20822 Contact with and (suspected) exposure to covid-19: Secondary | ICD-10-CM | POA: Diagnosis present

## 2019-08-16 DIAGNOSIS — T148XXA Other injury of unspecified body region, initial encounter: Secondary | ICD-10-CM

## 2019-08-16 HISTORY — PX: ORIF TIBIA PLATEAU: SHX2132

## 2019-08-16 HISTORY — DX: Displaced bicondylar fracture of left tibia, initial encounter for closed fracture: S82.142A

## 2019-08-16 HISTORY — PX: EXTERNAL FIXATION REMOVAL: SHX5040

## 2019-08-16 HISTORY — DX: Pneumonia, unspecified organism: J18.9

## 2019-08-16 LAB — CBC
HCT: 33.1 % — ABNORMAL LOW (ref 39.0–52.0)
Hemoglobin: 11.3 g/dL — ABNORMAL LOW (ref 13.0–17.0)
MCH: 31.7 pg (ref 26.0–34.0)
MCHC: 34.1 g/dL (ref 30.0–36.0)
MCV: 92.7 fL (ref 80.0–100.0)
Platelets: 459 10*3/uL — ABNORMAL HIGH (ref 150–400)
RBC: 3.57 MIL/uL — ABNORMAL LOW (ref 4.22–5.81)
RDW: 11.9 % (ref 11.5–15.5)
WBC: 13.6 10*3/uL — ABNORMAL HIGH (ref 4.0–10.5)
nRBC: 0 % (ref 0.0–0.2)

## 2019-08-16 LAB — CREATININE, SERUM
Creatinine, Ser: 0.84 mg/dL (ref 0.61–1.24)
GFR calc Af Amer: >60 mL/min (ref >60)
GFR calc non Af Amer: >60 mL/min (ref >60)

## 2019-08-16 SURGERY — OPEN REDUCTION INTERNAL FIXATION (ORIF) TIBIAL PLATEAU
Anesthesia: General | Laterality: Left

## 2019-08-16 MED ORDER — VANCOMYCIN HCL IN DEXTROSE 1-5 GM/200ML-% IV SOLN: 1000.0000 mg | INTRAVENOUS | Status: AC

## 2019-08-16 MED ORDER — GABAPENTIN 100 MG PO CAPS
100.0000 mg | ORAL_CAPSULE | Freq: Three times a day (TID) | ORAL | Status: DC
  Administered 2019-08-16 – 2019-08-17 (×3): 100 mg via ORAL
  Filled 2019-08-16 (×3): qty 1

## 2019-08-16 MED ORDER — 0.9 % SODIUM CHLORIDE (POUR BTL) OPTIME
TOPICAL | Status: DC | PRN
  Administered 2019-08-16: 1000 mL

## 2019-08-16 MED ORDER — DOCUSATE SODIUM 100 MG PO CAPS
100.0000 mg | ORAL_CAPSULE | Freq: Two times a day (BID) | ORAL | Status: DC
  Administered 2019-08-16 – 2019-08-17 (×2): 100 mg via ORAL
  Filled 2019-08-16 (×2): qty 1

## 2019-08-16 MED ORDER — MIDAZOLAM HCL 2 MG/2ML IJ SOLN
INTRAMUSCULAR | Status: AC
  Filled 2019-08-16: qty 2

## 2019-08-16 MED ORDER — METOCLOPRAMIDE HCL 5 MG/ML IJ SOLN: 5.0000 mg | Freq: Three times a day (TID) | INTRAMUSCULAR | Status: DC | PRN

## 2019-08-16 MED ORDER — FENTANYL CITRATE (PF) 250 MCG/5ML IJ SOLN
INTRAMUSCULAR | Status: AC
  Filled 2019-08-16: qty 5

## 2019-08-16 MED ORDER — TOBRAMYCIN SULFATE 1.2 G IJ SOLR
INTRAMUSCULAR | Status: AC
  Filled 2019-08-16: qty 1.2

## 2019-08-16 MED ORDER — OXYCODONE HCL 5 MG PO TABS
10.0000 mg | ORAL_TABLET | ORAL | Status: DC | PRN
  Administered 2019-08-17 (×5): 15 mg via ORAL
  Filled 2019-08-16 (×5): qty 3

## 2019-08-16 MED ORDER — ROCURONIUM BROMIDE 10 MG/ML (PF) SYRINGE
PREFILLED_SYRINGE | INTRAVENOUS | Status: AC
  Filled 2019-08-16: qty 20

## 2019-08-16 MED ORDER — ENOXAPARIN SODIUM 40 MG/0.4ML ~~LOC~~ SOLN
40.0000 mg | SUBCUTANEOUS | Status: DC
  Administered 2019-08-17: 40 mg via SUBCUTANEOUS
  Filled 2019-08-16: qty 0.4

## 2019-08-16 MED ORDER — FENTANYL CITRATE (PF) 100 MCG/2ML IJ SOLN
INTRAMUSCULAR | Status: DC | PRN
  Administered 2019-08-16 (×2): 50 ug via INTRAVENOUS
  Administered 2019-08-16: 25 ug via INTRAVENOUS
  Administered 2019-08-16 (×3): 50 ug via INTRAVENOUS
  Administered 2019-08-16: 75 ug via INTRAVENOUS
  Administered 2019-08-16 (×3): 50 ug via INTRAVENOUS

## 2019-08-16 MED ORDER — LIDOCAINE 2% (20 MG/ML) 5 ML SYRINGE
INTRAMUSCULAR | Status: DC | PRN
  Administered 2019-08-16: 100 mg via INTRAVENOUS

## 2019-08-16 MED ORDER — TOBRAMYCIN SULFATE 1.2 G IJ SOLR
INTRAMUSCULAR | Status: DC | PRN
  Administered 2019-08-16: 1.2 g

## 2019-08-16 MED ORDER — ACETAMINOPHEN 500 MG PO TABS
ORAL_TABLET | ORAL | Status: AC
  Administered 2019-08-16: 1000 mg via ORAL
  Filled 2019-08-16: qty 2

## 2019-08-16 MED ORDER — CEFAZOLIN SODIUM-DEXTROSE 2-4 GM/100ML-% IV SOLN
INTRAVENOUS | Status: AC
  Filled 2019-08-16: qty 100

## 2019-08-16 MED ORDER — METHOCARBAMOL 500 MG PO TABS
500.0000 mg | ORAL_TABLET | Freq: Four times a day (QID) | ORAL | Status: DC | PRN
  Administered 2019-08-16 – 2019-08-17 (×4): 500 mg via ORAL
  Filled 2019-08-16 (×4): qty 1

## 2019-08-16 MED ORDER — VANCOMYCIN HCL 1000 MG IV SOLR
INTRAVENOUS | Status: DC | PRN
  Administered 2019-08-16: 1000 mg

## 2019-08-16 MED ORDER — POTASSIUM CHLORIDE IN NACL 20-0.9 MEQ/L-% IV SOLN
INTRAVENOUS | Status: DC
  Filled 2019-08-16 (×2): qty 1000

## 2019-08-16 MED ORDER — ONDANSETRON HCL 4 MG PO TABS: 4.0000 mg | ORAL_TABLET | Freq: Four times a day (QID) | ORAL | Status: DC | PRN

## 2019-08-16 MED ORDER — KETAMINE HCL 50 MG/5ML IJ SOSY
PREFILLED_SYRINGE | INTRAMUSCULAR | Status: AC
  Filled 2019-08-16: qty 5

## 2019-08-16 MED ORDER — DEXAMETHASONE SODIUM PHOSPHATE 10 MG/ML IJ SOLN
INTRAMUSCULAR | Status: DC | PRN
  Administered 2019-08-16: 10 mg via INTRAVENOUS

## 2019-08-16 MED ORDER — HYDROMORPHONE HCL 1 MG/ML IJ SOLN
0.5000 mg | INTRAMUSCULAR | Status: DC | PRN
  Administered 2019-08-16 – 2019-08-17 (×3): 1 mg via INTRAVENOUS
  Filled 2019-08-16 (×3): qty 1

## 2019-08-16 MED ORDER — PROMETHAZINE HCL 25 MG/ML IJ SOLN
6.2500 mg | INTRAMUSCULAR | Status: DC | PRN
  Administered 2019-08-16: 6.25 mg via INTRAVENOUS

## 2019-08-16 MED ORDER — HYDROMORPHONE HCL 1 MG/ML IJ SOLN
INTRAMUSCULAR | Status: AC
  Filled 2019-08-16: qty 1

## 2019-08-16 MED ORDER — KETAMINE HCL 10 MG/ML IJ SOLN
INTRAMUSCULAR | Status: DC | PRN
  Administered 2019-08-16 (×2): 10 mg via INTRAVENOUS
  Administered 2019-08-16: 20 mg via INTRAVENOUS
  Administered 2019-08-16: 10 mg via INTRAVENOUS

## 2019-08-16 MED ORDER — MIDAZOLAM HCL 5 MG/5ML IJ SOLN
INTRAMUSCULAR | Status: DC | PRN
  Administered 2019-08-16: 2 mg via INTRAVENOUS

## 2019-08-16 MED ORDER — PROPOFOL 10 MG/ML IV BOLUS
INTRAVENOUS | Status: DC | PRN
  Administered 2019-08-16: 200 mg via INTRAVENOUS

## 2019-08-16 MED ORDER — CEFAZOLIN SODIUM-DEXTROSE 2-4 GM/100ML-% IV SOLN
2.0000 g | Freq: Three times a day (TID) | INTRAVENOUS | Status: AC
  Administered 2019-08-17 (×3): 2 g via INTRAVENOUS
  Filled 2019-08-16 (×3): qty 100

## 2019-08-16 MED ORDER — SUGAMMADEX SODIUM 500 MG/5ML IV SOLN
INTRAVENOUS | Status: AC
  Filled 2019-08-16: qty 5

## 2019-08-16 MED ORDER — SUGAMMADEX SODIUM 500 MG/5ML IV SOLN
INTRAVENOUS | Status: DC | PRN
  Administered 2019-08-16: 200 mg via INTRAVENOUS

## 2019-08-16 MED ORDER — HYDROMORPHONE HCL 1 MG/ML IJ SOLN
INTRAMUSCULAR | Status: DC | PRN
  Administered 2019-08-16: 1 mg via INTRAVENOUS
  Administered 2019-08-16: .5 mg via INTRAVENOUS

## 2019-08-16 MED ORDER — PROMETHAZINE HCL 25 MG/ML IJ SOLN
INTRAMUSCULAR | Status: AC
  Filled 2019-08-16: qty 1

## 2019-08-16 MED ORDER — VANCOMYCIN HCL IN DEXTROSE 1-5 GM/200ML-% IV SOLN
INTRAVENOUS | Status: AC
  Administered 2019-08-16: 1000 mg via INTRAVENOUS
  Filled 2019-08-16: qty 200

## 2019-08-16 MED ORDER — ACETAMINOPHEN 500 MG PO TABS: 1000.0000 mg | ORAL_TABLET | Freq: Once | ORAL | Status: AC

## 2019-08-16 MED ORDER — ONDANSETRON HCL 4 MG/2ML IJ SOLN
INTRAMUSCULAR | Status: AC
  Filled 2019-08-16: qty 8

## 2019-08-16 MED ORDER — HYDROMORPHONE HCL 1 MG/ML IJ SOLN
0.2500 mg | INTRAMUSCULAR | Status: DC | PRN
  Administered 2019-08-16 (×2): 0.5 mg via INTRAVENOUS

## 2019-08-16 MED ORDER — ACETAMINOPHEN 500 MG PO TABS
1000.0000 mg | ORAL_TABLET | Freq: Four times a day (QID) | ORAL | Status: DC
  Administered 2019-08-16 – 2019-08-17 (×4): 1000 mg via ORAL
  Filled 2019-08-16 (×4): qty 2

## 2019-08-16 MED ORDER — POVIDONE-IODINE 10 % EX SWAB
2.0000 "application " | Freq: Once | CUTANEOUS | Status: AC
  Administered 2019-08-16: 2 via TOPICAL

## 2019-08-16 MED ORDER — ONDANSETRON HCL 4 MG/2ML IJ SOLN: 4.0000 mg | Freq: Four times a day (QID) | INTRAMUSCULAR | Status: DC | PRN

## 2019-08-16 MED ORDER — LACTATED RINGERS IV SOLN: INTRAVENOUS | Status: DC | PRN

## 2019-08-16 MED ORDER — CLOMIPHENE CITRATE 50 MG PO TABS
25.0000 mg | ORAL_TABLET | Freq: Every day | ORAL | Status: DC
  Administered 2019-08-17: 25 mg via ORAL
  Filled 2019-08-16 (×2): qty 1

## 2019-08-16 MED ORDER — ROCURONIUM BROMIDE 50 MG/5ML IV SOSY
PREFILLED_SYRINGE | INTRAVENOUS | Status: DC | PRN
  Administered 2019-08-16 (×2): 20 mg via INTRAVENOUS
  Administered 2019-08-16: 60 mg via INTRAVENOUS
  Administered 2019-08-16: 20 mg via INTRAVENOUS

## 2019-08-16 MED ORDER — DEXMEDETOMIDINE HCL 200 MCG/2ML IV SOLN
INTRAVENOUS | Status: DC | PRN
  Administered 2019-08-16 (×3): 4 ug via INTRAVENOUS
  Administered 2019-08-16: 8 ug via INTRAVENOUS
  Administered 2019-08-16: 4 ug via INTRAVENOUS

## 2019-08-16 MED ORDER — CHLORHEXIDINE GLUCONATE 4 % EX LIQD: 60.0000 mL | Freq: Once | CUTANEOUS | Status: DC

## 2019-08-16 MED ORDER — VANCOMYCIN HCL 1000 MG IV SOLR
INTRAVENOUS | Status: AC
  Filled 2019-08-16: qty 1000

## 2019-08-16 MED ORDER — LABETALOL HCL 5 MG/ML IV SOLN
INTRAVENOUS | Status: DC | PRN
  Administered 2019-08-16 (×4): 2.5 mg via INTRAVENOUS

## 2019-08-16 MED ORDER — CEFAZOLIN SODIUM-DEXTROSE 2-4 GM/100ML-% IV SOLN
2.0000 g | INTRAVENOUS | Status: AC
  Administered 2019-08-16 (×2): 2 g via INTRAVENOUS

## 2019-08-16 MED ORDER — POLYETHYLENE GLYCOL 3350 17 G PO PACK: 17.0000 g | PACK | Freq: Every day | ORAL | Status: DC | PRN

## 2019-08-16 MED ORDER — ONDANSETRON HCL 4 MG/2ML IJ SOLN
INTRAMUSCULAR | Status: DC | PRN
  Administered 2019-08-16: 4 mg via INTRAVENOUS

## 2019-08-16 MED ORDER — PHENYLEPHRINE 40 MCG/ML (10ML) SYRINGE FOR IV PUSH (FOR BLOOD PRESSURE SUPPORT)
PREFILLED_SYRINGE | INTRAVENOUS | Status: AC
  Filled 2019-08-16: qty 10

## 2019-08-16 MED ORDER — LIDOCAINE 2% (20 MG/ML) 5 ML SYRINGE
INTRAMUSCULAR | Status: AC
  Filled 2019-08-16: qty 5

## 2019-08-16 MED ORDER — DEXAMETHASONE SODIUM PHOSPHATE 10 MG/ML IJ SOLN
INTRAMUSCULAR | Status: AC
  Filled 2019-08-16: qty 3

## 2019-08-16 MED ORDER — VITAMIN D 25 MCG (1000 UNIT) PO TABS
2000.0000 [IU] | ORAL_TABLET | Freq: Two times a day (BID) | ORAL | Status: DC
  Administered 2019-08-16 – 2019-08-17 (×2): 2000 [IU] via ORAL
  Filled 2019-08-16 (×2): qty 2

## 2019-08-16 MED ORDER — LIDOCAINE 2% (20 MG/ML) 5 ML SYRINGE
INTRAMUSCULAR | Status: AC
  Filled 2019-08-16: qty 15

## 2019-08-16 MED ORDER — OXYCODONE HCL 5 MG PO TABS
5.0000 mg | ORAL_TABLET | ORAL | Status: DC | PRN
  Administered 2019-08-16: 10 mg via ORAL
  Filled 2019-08-16: qty 2

## 2019-08-16 MED ORDER — METHOCARBAMOL 1000 MG/10ML IJ SOLN
500.0000 mg | Freq: Four times a day (QID) | INTRAVENOUS | Status: DC | PRN
  Filled 2019-08-16: qty 5

## 2019-08-16 MED ORDER — METOCLOPRAMIDE HCL 5 MG PO TABS: 5.0000 mg | ORAL_TABLET | Freq: Three times a day (TID) | ORAL | Status: DC | PRN

## 2019-08-16 MED ORDER — HYDROMORPHONE HCL 1 MG/ML IJ SOLN
INTRAMUSCULAR | Status: AC
  Filled 2019-08-16: qty 0.5

## 2019-08-16 MED ORDER — PROPOFOL 10 MG/ML IV BOLUS
INTRAVENOUS | Status: AC
  Filled 2019-08-16: qty 20

## 2019-08-16 SURGICAL SUPPLY — 103 items
BANDAGE ESMARK 6X9 LF (GAUZE/BANDAGES/DRESSINGS) ×1 IMPLANT
BIT DRILL 2.5MM SMALL QC EVOS (BIT) ×1 IMPLANT
BIT DRILL OVR 3.5AO QC SHRT SM (DRILL) ×1 IMPLANT
BIT DRILL QC 2.5MM SHRT EVO SM (DRILL) ×2 IMPLANT
BLADE CLIPPER SURG (BLADE) IMPLANT
BLADE SURG 15 STRL LF DISP TIS (BLADE) ×2 IMPLANT
BLADE SURG 15 STRL SS (BLADE) ×6
BNDG ELASTIC 4X5.8 VLCR STR LF (GAUZE/BANDAGES/DRESSINGS) IMPLANT
BNDG ELASTIC 6X10 VLCR STRL LF (GAUZE/BANDAGES/DRESSINGS) ×3 IMPLANT
BNDG ELASTIC 6X5.8 VLCR STR LF (GAUZE/BANDAGES/DRESSINGS) IMPLANT
BNDG ESMARK 6X9 LF (GAUZE/BANDAGES/DRESSINGS) ×3
BNDG GAUZE ELAST 4 BULKY (GAUZE/BANDAGES/DRESSINGS) IMPLANT
BRUSH SCRUB EZ PLAIN DRY (MISCELLANEOUS) ×6 IMPLANT
CANISTER SUCT 3000ML PPV (MISCELLANEOUS) IMPLANT
CHLORAPREP W/TINT 26 (MISCELLANEOUS) ×6 IMPLANT
COVER SURGICAL LIGHT HANDLE (MISCELLANEOUS) ×3 IMPLANT
COVER WAND RF STERILE (DRAPES) IMPLANT
CUFF TOURN SGL QUICK 34 (TOURNIQUET CUFF)
CUFF TRNQT CYL 34X4.125X (TOURNIQUET CUFF) IMPLANT
DRAPE C-ARM 42X72 X-RAY (DRAPES) ×3 IMPLANT
DRAPE C-ARMOR (DRAPES) ×3 IMPLANT
DRAPE ORTHO SPLIT 77X108 STRL (DRAPES) ×6
DRAPE SURG ORHT 6 SPLT 77X108 (DRAPES) ×2 IMPLANT
DRAPE U-SHAPE 47X51 STRL (DRAPES) ×3 IMPLANT
DRILL 2.5MM SMALL QC EVOS (BIT) ×3
DRILL OVER 3.5 AO QC SHORT SM (DRILL) ×3
DRILL QC 2.5MM SHORT EVOS SM (DRILL) ×6
DRSG ADAPTIC 3X8 NADH LF (GAUZE/BANDAGES/DRESSINGS) IMPLANT
DRSG MEPITEL 4X7.2 (GAUZE/BANDAGES/DRESSINGS) ×9 IMPLANT
DRSG PAD ABDOMINAL 8X10 ST (GAUZE/BANDAGES/DRESSINGS) ×6 IMPLANT
ELECT REM PT RETURN 9FT ADLT (ELECTROSURGICAL) ×3
ELECTRODE REM PT RTRN 9FT ADLT (ELECTROSURGICAL) ×1 IMPLANT
GAUZE SPONGE 4X4 12PLY STRL (GAUZE/BANDAGES/DRESSINGS) ×3 IMPLANT
GLOVE BIO SURGEON STRL SZ 6.5 (GLOVE) ×6 IMPLANT
GLOVE BIO SURGEON STRL SZ7.5 (GLOVE) ×12 IMPLANT
GLOVE BIO SURGEONS STRL SZ 6.5 (GLOVE) ×3
GLOVE BIOGEL PI IND STRL 6.5 (GLOVE) ×1 IMPLANT
GLOVE BIOGEL PI IND STRL 7.5 (GLOVE) ×1 IMPLANT
GLOVE BIOGEL PI INDICATOR 6.5 (GLOVE) ×2
GLOVE BIOGEL PI INDICATOR 7.5 (GLOVE) ×2
GOWN STRL REUS W/ TWL LRG LVL3 (GOWN DISPOSABLE) ×1 IMPLANT
GOWN STRL REUS W/TWL LRG LVL3 (GOWN DISPOSABLE) ×3
IMMOBILIZER KNEE 22 UNIV (SOFTGOODS) ×3 IMPLANT
K-WIRE 1.6 (WIRE) ×3
K-WIRE 2.0 (WIRE) ×2
K-WIRE FX150X1.6XTROC PNT (WIRE) ×1
K-WIRE TROCAR PT 2.0 150MM (WIRE) ×1
KIT BASIN OR (CUSTOM PROCEDURE TRAY) ×3 IMPLANT
KIT TURNOVER KIT B (KITS) ×3 IMPLANT
KWIRE FX150X1.6XTROC PNT (WIRE) ×1 IMPLANT
KWIRE TROCAR PT 2.0 150MM (WIRE) ×1 IMPLANT
MANIFOLD NEPTUNE II (INSTRUMENTS) ×3 IMPLANT
NDL SUT 6 .5 CRC .975X.05 MAYO (NEEDLE) ×1 IMPLANT
NEEDLE MAYO TAPER (NEEDLE) ×3
NS IRRIG 1000ML POUR BTL (IV SOLUTION) ×3 IMPLANT
PACK TOTAL JOINT (CUSTOM PROCEDURE TRAY) ×3 IMPLANT
PAD ABD 8X10 STRL (GAUZE/BANDAGES/DRESSINGS) ×3 IMPLANT
PAD ARMBOARD 7.5X6 YLW CONV (MISCELLANEOUS) ×6 IMPLANT
PAD CAST 4YDX4 CTTN HI CHSV (CAST SUPPLIES) ×1 IMPLANT
PADDING CAST COTTON 4X4 STRL (CAST SUPPLIES) ×2
PADDING CAST COTTON 6X4 STRL (CAST SUPPLIES) ×3 IMPLANT
PLATE TIB EVOS 10H L 3.5X138 (Plate) ×3 IMPLANT
PLATE TIB EVOS 16H L 3.5X200 (Plate) ×3 IMPLANT
RETRIEVER SUT HEWSON (MISCELLANEOUS) ×3 IMPLANT
SCREW CORT 3.5X32 ST EVOS (Screw) ×9 IMPLANT
SCREW CORT ST EVOS 3.5X55 (Screw) ×3 IMPLANT
SCREW CORT ST EVOS 3.5X70 (Screw) ×3 IMPLANT
SCREW CORT ST EVOS 3.5X75 (Screw) ×3 IMPLANT
SCREW CORT ST EVOS 3.5X80 (Screw) ×3 IMPLANT
SCREW CTX 3.5X36MM EVOS (Screw) ×12 IMPLANT
SCREW CTX 3.5X50MM EVOS (Screw) ×3 IMPLANT
SCREW CTX ST EVOS 3.5X40 (Screw) ×3 IMPLANT
SCREW LOCK 3.5X50MM EVOS (Screw) ×3 IMPLANT
SCREW LOCK 3.5X65MM (Screw) ×3 IMPLANT
SCREW LOCK EVOS ST 3.5X85 (Screw) ×6 IMPLANT
SCREW LOCK ST EVOS 3.5 X 26 (Screw) ×3 IMPLANT
SCREW LOCK ST EVOS 3.5 X 55 (Screw) ×3 IMPLANT
SCREW LOCK ST EVOS 3.5 X 80 (Screw) ×3 IMPLANT
SPONGE LAP 18X18 RF (DISPOSABLE) ×3 IMPLANT
STAPLER VISISTAT 35W (STAPLE) IMPLANT
SUCTION FRAZIER HANDLE 10FR (MISCELLANEOUS) ×3
SUCTION TUBE FRAZIER 10FR DISP (MISCELLANEOUS) ×1 IMPLANT
SUT ETHILON 2 0 FS 18 (SUTURE) ×3 IMPLANT
SUT ETHILON 3 0 PS 1 (SUTURE) ×12 IMPLANT
SUT FIBERWIRE #2 38 T-5 BLUE (SUTURE)
SUT MNCRL AB 3-0 PS2 18 (SUTURE) IMPLANT
SUT MON AB 2-0 CT1 36 (SUTURE) IMPLANT
SUT SILK 3 0 (SUTURE) ×3
SUT SILK 3-0 18XBRD TIE 12 (SUTURE) ×1 IMPLANT
SUT VIC AB 0 CT1 27 (SUTURE) ×3
SUT VIC AB 0 CT1 27XBRD ANBCTR (SUTURE) ×1 IMPLANT
SUT VIC AB 1 CT1 18XCR BRD 8 (SUTURE) ×2 IMPLANT
SUT VIC AB 1 CT1 27 (SUTURE) ×2
SUT VIC AB 1 CT1 27XBRD ANBCTR (SUTURE) ×1 IMPLANT
SUT VIC AB 1 CT1 8-18 (SUTURE) ×6
SUT VIC AB 2-0 CT1 27 (SUTURE) ×6
SUT VIC AB 2-0 CT1 TAPERPNT 27 (SUTURE) ×2 IMPLANT
SUTURE FIBERWR #2 38 T-5 BLUE (SUTURE) IMPLANT
TOWEL GREEN STERILE (TOWEL DISPOSABLE) ×3 IMPLANT
TOWEL GREEN STERILE FF (TOWEL DISPOSABLE) IMPLANT
TRAY FOLEY MTR SLVR 16FR STAT (SET/KITS/TRAYS/PACK) IMPLANT
UNDERPAD 30X30 (UNDERPADS AND DIAPERS) ×3 IMPLANT
WATER STERILE IRR 1000ML POUR (IV SOLUTION) IMPLANT

## 2019-08-16 NOTE — Interval H&P Note (Signed)
History and Physical Interval Note:  08/16/2019 12:15 PM  Jeffery Cooley  has presented today for surgery, with the diagnosis of LEFT BICONDYLAR TIBIAL PLATEAU FRACTURE WITH RETAINED EXTERNAL FIXATION.  The various methods of treatment have been discussed with the patient and family. After consideration of risks, benefits and other options for treatment, the patient has consented to  Procedure(s): OPEN REDUCTION INTERNAL FIXATION (ORIF) BICONDYLAR TIBIAL PLATEAU (Left) REMOVAL EXTERNAL FIXATION LEG (Left) as a surgical intervention.  The patient's history has been reviewed, patient examined, no change in status, stable for surgery.  I have reviewed the patient's chart and labs.  Questions were answered to the patient's satisfaction.     Caryn Bee P Krystal Delduca

## 2019-08-16 NOTE — Anesthesia Preprocedure Evaluation (Addendum)
Anesthesia Evaluation  Patient identified by MRN, date of birth, ID band Patient awake    Reviewed: Allergy & Precautions, NPO status , Patient's Chart, lab work & pertinent test results  Airway Mallampati: II  TM Distance: >3 FB Neck ROM: Full    Dental  (+) Teeth Intact, Dental Advisory Given   Pulmonary Current Smoker and Patient abstained from smoking.,    Pulmonary exam normal breath sounds clear to auscultation       Cardiovascular negative cardio ROS Normal cardiovascular exam Rhythm:Regular Rate:Normal     Neuro/Psych negative neurological ROS  negative psych ROS   GI/Hepatic negative GI ROS, Neg liver ROS,   Endo/Other  negative endocrine ROS  Renal/GU negative Renal ROS     Musculoskeletal LEFT BICONDYLAR TIBIAL PLATEAU FRACTURE WITH RETAINED EXTERNAL FIXATION   Abdominal   Peds  Hematology negative hematology ROS (+)   Anesthesia Other Findings Day of surgery medications reviewed with the patient.  Reproductive/Obstetrics                             Anesthesia Physical Anesthesia Plan  ASA: II  Anesthesia Plan: General   Post-op Pain Management:    Induction: Intravenous  PONV Risk Score and Plan: 2 and Midazolam, Dexamethasone and Ondansetron  Airway Management Planned: Oral ETT  Additional Equipment:   Intra-op Plan:   Post-operative Plan: Extubation in OR  Informed Consent: I have reviewed the patients History and Physical, chart, labs and discussed the procedure including the risks, benefits and alternatives for the proposed anesthesia with the patient or authorized representative who has indicated his/her understanding and acceptance.     Dental advisory given and Dental Advisory Given  Plan Discussed with: CRNA  Anesthesia Plan Comments:        Anesthesia Quick Evaluation
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
## 2019-08-16 NOTE — Anesthesia Procedure Notes (Signed)
Procedure Name: Intubation Date/Time: 08/16/2019 10:25 AM Performed by: Neldon Newport, CRNA Pre-anesthesia Checklist: Patient being monitored, Timeout performed, Suction available, Emergency Drugs available and Patient identified Patient Re-evaluated:Patient Re-evaluated prior to induction Oxygen Delivery Method: Circle system utilized Preoxygenation: Pre-oxygenation with 100% oxygen Induction Type: IV induction Ventilation: Mask ventilation without difficulty Laryngoscope Size: Mac and 4 Grade View: Grade I Tube type: Oral Tube size: 7.5 mm Number of attempts: 1 Placement Confirmation: ETT inserted through vocal cords under direct vision,  positive ETCO2 and breath sounds checked- equal and bilateral Secured at: 24 cm Tube secured with: Tape Dental Injury: Teeth and Oropharynx as per pre-operative assessment

## 2019-08-16 NOTE — Transfer of Care (Signed)
Immediate Anesthesia Transfer of Care Note  Patient: Jeffery Cooley  Procedure(s) Performed: OPEN REDUCTION INTERNAL FIXATION (ORIF) BICONDYLAR TIBIAL PLATEAU (Left ) REMOVAL EXTERNAL FIXATION LEG (Left )  Patient Location: PACU  Anesthesia Type:General  Level of Consciousness: awake and patient cooperative  Airway & Oxygen Therapy: Patient Spontanous Breathing and Patient connected to nasal cannula oxygen  Post-op Assessment: Report given to RN and Post -op Vital signs reviewed and stable  Post vital signs: Reviewed and stable  Last Vitals:  Vitals Value Taken Time  BP 125/72 08/16/19 1646  Temp    Pulse 91 08/16/19 1651  Resp 15 08/16/19 1651  SpO2 100 % 08/16/19 1651  Vitals shown include unvalidated device data.  Last Pain:  Vitals:   08/16/19 1043  TempSrc:   PainSc: 4          Complications: No apparent anesthesia complications

## 2019-08-16 NOTE — Progress Notes (Signed)
Orthopedic Tech Progress Note Patient Details:  Jeffery Cooley December 15, 1980 [id number] Called in order to [facility] for a [facility]  Patient ID: RILEY HALLUM, male   DOB: [birth year], 39 y.o.   MRN: [id number]   MOUHAMAD TEED 08/16/2019, 7:23 PM
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
## 2019-08-16 NOTE — Anesthesia Postprocedure Evaluation (Signed)
Anesthesia Post Note  Patient: Jeffery Cooley  Procedure(s) Performed: OPEN REDUCTION INTERNAL FIXATION (ORIF) BICONDYLAR TIBIAL PLATEAU (Left ) REMOVAL EXTERNAL FIXATION LEG (Left )     Patient location during evaluation: PACU Anesthesia Type: General Level of consciousness: awake and alert Pain management: pain level controlled Vital Signs Assessment: post-procedure vital signs reviewed and stable Respiratory status: spontaneous breathing, nonlabored ventilation and respiratory function stable Cardiovascular status: blood pressure returned to baseline and stable Postop Assessment: no apparent nausea or vomiting Anesthetic complications: no    Last Vitals:  Vitals:   08/16/19 1745 08/16/19 1834  BP: (!) 149/76 129/72  Pulse: 93 98  Resp: 16 17  Temp: 37.2 C 37.2 C  SpO2: 97% 93%    Last Pain:  Vitals:   08/16/19 1834  TempSrc: Oral  PainSc:                  Beryle Lathe

## 2019-08-17 ENCOUNTER — Encounter (HOSPITAL_COMMUNITY): Payer: Self-pay | Admitting: Student

## 2019-08-17 HISTORY — PX: ORIF TIBIA PLATEAU: SHX2132

## 2019-08-17 LAB — CBC
HCT: 30.3 % — ABNORMAL LOW (ref 39.0–52.0)
Hemoglobin: 10 g/dL — ABNORMAL LOW (ref 13.0–17.0)
MCH: 31.3 pg (ref 26.0–34.0)
MCHC: 33 g/dL (ref 30.0–36.0)
MCV: 94.7 fL (ref 80.0–100.0)
Platelets: 410 10*3/uL — ABNORMAL HIGH (ref 150–400)
RBC: 3.2 MIL/uL — ABNORMAL LOW (ref 4.22–5.81)
RDW: 11.9 % (ref 11.5–15.5)
WBC: 9.5 10*3/uL (ref 4.0–10.5)
nRBC: 0 % (ref 0.0–0.2)

## 2019-08-17 LAB — BASIC METABOLIC PANEL
Anion gap: 8 (ref 5–15)
BUN: 12 mg/dL (ref 6–20)
CO2: 27 mmol/L (ref 22–32)
Calcium: 7.9 mg/dL — ABNORMAL LOW (ref 8.9–10.3)
Chloride: 103 mmol/L (ref 98–111)
Creatinine, Ser: 0.73 mg/dL (ref 0.61–1.24)
GFR calc Af Amer: >60 mL/min (ref >60)
GFR calc non Af Amer: >60 mL/min (ref >60)
Glucose, Bld: 134 mg/dL — ABNORMAL HIGH (ref 70–99)
Potassium: 4 mmol/L (ref 3.5–5.1)
Sodium: 138 mmol/L (ref 135–145)

## 2019-08-17 MED ORDER — METHOCARBAMOL 750 MG PO TABS: 750.0000 mg | ORAL_TABLET | Freq: Four times a day (QID) | ORAL | 0 refills | Status: DC | PRN

## 2019-08-17 MED ORDER — GABAPENTIN 100 MG PO CAPS: 100.0000 mg | ORAL_CAPSULE | Freq: Three times a day (TID) | ORAL | 0 refills | Status: DC

## 2019-08-17 MED ORDER — OXYCODONE-ACETAMINOPHEN 7.5-325 MG PO TABS: 1.0000 | ORAL_TABLET | ORAL | 0 refills | Status: DC | PRN

## 2019-08-17 NOTE — Plan of Care (Signed)
  Problem: Health Behavior/Discharge Planning: Goal: Ability to manage health-related needs will improve Outcome: Progressing   Problem: Activity: Goal: Risk for activity intolerance will decrease Outcome: Progressing   Problem: Pain Managment: Goal: General experience of comfort will improve Outcome: Progressing   

## 2019-08-17 NOTE — Progress Notes (Addendum)
Pt given discharge instructions and gone over with him. He verbalized understanding. Pt stated he felt his pain would be adequately controlled on home medications.  All questions answered. Pt to be transported home by daughter.

## 2019-08-17 NOTE — TOC Transition Note (Signed)
Transition of Care [facility]) - CM/SW Discharge Note   Patient Details  Name: Jeffery Cooley MRN: [id number] Date of Birth: 08-May-1980  Transition of Care [facility]) CM/SW Contact:  Lawerance Sabal, RN Phone Number: 08/17/2019, 2:41 PM   Clinical Narrative:    Notified by [facility] that patient is active. Spoke w patient, he confirms and denies need for DME. [facility] notified of DC today. No other CM needs identified.     Final next level of care: Home w Home Health Services Barriers to Discharge: No Barriers Identified   Patient Goals and CMS Choice        Discharge Placement                       Discharge Plan and Services                          HH Arranged: PT HH Agency: [facility] Date HH Agency Contacted: 08/17/19 Time HH Agency Contacted: 1441 Representative spoke with at [facility] Agency: Lupita Leash  Social Determinants of Health (SDOH) Interventions     Readmission Risk Interventions Readmission Risk Prevention Plan 08/05/2019  Post Dischage Appt Complete  Medication Screening Complete  Transportation Screening Complete  Some recent data might be hidden

## 2019-08-17 NOTE — Discharge Summary (Signed)
[facility]edic [facility]uma Service (OTS) Discharge Summary   Patient ID: Jeffery Cooley MRN: [id number] DOB/AGE: 41-Dec-1982 41 y.o.  Admit date: 08/16/2019 Discharge date: 08/17/2019  Admission Diagnoses: Left tibial plateau fracture  Discharge Diagnoses:  Active Problems:   Closed bicondylar fracture of left tibial plateau   Past Medical History:  Diagnosis Date  . Pneumonia    " as a kid"  . Tibial plateau fracture, left    bicondylar     Procedures Performed: 1. CPT 27536-Open reduction internal fixation of left tibial plateau fracture 2. CPT 27540-Open reduction internal fixation of left tibial tuberosity 3. CPT 27403-Repair of left lateral meniscus tear 4. CPT 20694-Removal of external fixator left leg 5. CPT 11044-Debridement of external fixator pin sites  Discharged Condition: good/stable  Hospital Course: Patient presented to [facility] on 08/16/2019 and underwent the above scheduled procedure.  He tolerated the procedure well without complications.  Was placed in a hinged knee brace postoperatively and allowed full unrestricted range of motion in the brace.  Began working with physical and occupational therapy starting on postoperative day #1, was able to safely ambulate with the walker while remaining nonweightbearing on the left lower extremity.  Was started on Lovenox for DVT prophylaxis starting on postoperative day #1. On the afternoon of 08/17/2019, the patient was tolerating diet, working well with therapies, pain well controlled, vital signs stable, dressings clean, dry, intact and felt stable for discharge to home. Patient will follow up as below and knows to call with questions or concerns.     Consults: None  Significant Diagnostic Studies:   Results for orders placed or performed during the hospital encounter of 08/16/19 (from the past 168 hour(s))  CBC   Collection Time: 08/16/19  6:41 PM  Result Value Ref Range   WBC 13.6 (H) 4.0 - 10.5 K/uL   RBC  3.57 (L) 4.22 - 5.81 MIL/uL   Hemoglobin 11.3 (L) 13.0 - 17.0 g/dL   HCT 33.1 (L) 39.0 - 52.0 %   MCV 92.7 80.0 - 100.0 fL   MCH 31.7 26.0 - 34.0 pg   MCHC 34.1 30.0 - 36.0 g/dL   RDW 11.9 11.5 - 15.5 %   Platelets 459 (H) 150 - 400 K/uL   nRBC 0.0 0.0 - 0.2 %  Creatinine, serum   Collection Time: 08/16/19  6:41 PM  Result Value Ref Range   Creatinine, Ser 0.84 0.61 - 1.24 mg/dL   GFR calc non Af Amer >60 >60 mL/min   GFR calc Af Amer >60 >60 mL/min  Basic metabolic panel   Collection Time: 08/17/19  1:57 AM  Result Value Ref Range   Sodium 138 135 - 145 mmol/L   Potassium 4.0 3.5 - 5.1 mmol/L   Chloride 103 98 - 111 mmol/L   CO2 27 22 - 32 mmol/L   Glucose, Bld 134 (H) 70 - 99 mg/dL   BUN 12 6 - 20 mg/dL   Creatinine, Ser 0.73 0.61 - 1.24 mg/dL   Calcium 7.9 (L) 8.9 - 10.3 mg/dL   GFR calc non Af Amer >60 >60 mL/min   GFR calc Af Amer >60 >60 mL/min   Anion gap 8 5 - 15  CBC   Collection Time: 08/17/19  1:57 AM  Result Value Ref Range   WBC 9.5 4.0 - 10.5 K/uL   RBC 3.20 (L) 4.22 - 5.81 MIL/uL   Hemoglobin 10.0 (L) 13.0 - 17.0 g/dL   HCT 30.3 (L) 39.0 - 52.0 %  MCV 94.7 80.0 - 100.0 fL   MCH 31.3 26.0 - 34.0 pg   MCHC 33.0 30.0 - 36.0 g/dL   RDW 16.1 09.6 - 04.5 %   Platelets 410 (H) 150 - 400 K/uL   nRBC 0.0 0.0 - 0.2 %  Results for orders placed or performed during the hospital encounter of 08/13/19 (from the past 168 hour(s))  SARS CORONAVIRUS 2 (TAT 6-24 HRS) Nasopharyngeal Nasopharyngeal Swab   Collection Time: 08/13/19  3:07 PM   Specimen: Nasopharyngeal Swab  Result Value Ref Range   SARS Coronavirus 2 NEGATIVE NEGATIVE     Treatments: 1. CPT 27536-Open reduction internal fixation of left tibial plateau fracture 2. CPT 27540-Open reduction internal fixation of left tibial tuberosity 3. CPT 27403-Repair of left lateral meniscus tear 4. CPT 20694-Removal of external fixator left leg 5. CPT 11044-Debridement of external fixator pin sites  Discharge  Exam: General: Sitting up in bed, NAD Respiratory:  No increased work of breathing.  Left Lower Extremity: Hinged knee brace in place.  Dressing with small amount of dried blood over anterior tibia, likely from pin removal site.  Dressing is otherwise clean dry and intact.  Ankle dorsiflexion and plantarflexion is intact.  Knee motion not attempted.  Compartments are soft and compressible.  Sensation is intact to light touch distally.+ EHL.+ FHL.  2+ DP pulse  Disposition: Discharge disposition: 06-Home-Health Care Svc        Allergies as of 08/17/2019      Reactions   Ceclor [cefaclor]       Medication List    TAKE these medications   acetaminophen 500 MG tablet Commonly known as: TYLENOL Take 500-1,000 mg by mouth every 6 (six) hours as needed (for pain.).   clomiPHENE 50 MG tablet Commonly known as: CLOMID Take 25 mg by mouth daily.   enoxaparin 40 MG/0.4ML injection Commonly known as: LOVENOX Inject 0.4 mLs (40 mg total) into the skin daily for 30 doses. For 30 days post op for DVT prophylaxis   gabapentin 100 MG capsule Commonly known as: NEURONTIN Take 1 capsule (100 mg total) by mouth 3 (three) times daily.   hydrocortisone cream 1 % Apply 1 application topically 2 (two) times daily as needed (rash).   methocarbamol 750 MG tablet Commonly known as: Robaxin-750 Take 1 tablet (750 mg total) by mouth every 6 (six) hours as needed for muscle spasms.   multivitamin with minerals Tabs tablet Take 1 tablet by mouth daily.   oxyCODONE-acetaminophen 7.5-325 MG tablet Commonly known as: Percocet Take 1 tablet by mouth every 4 (four) hours as needed for severe pain.   phentermine 37.5 MG tablet Commonly known as: ADIPEX-P One tab by mouth qAM What changed:   how much to take  how to take this  when to take this  additional instructions   tadalafil 5 MG tablet Commonly known as: Cialis Take 1 tablet (5 mg total) by mouth daily as needed for erectile  dysfunction.   Vitamin D 125 MCG (5000 UT) Caps Take 5,000 Units by mouth daily.      Follow-up Information    Haddix, Gillie Manners, MD. Schedule an appointment as soon as possible for a visit in 2 week(s).   Specialty: Orthopedic Surgery Why: for repeat x-rays and suture removal Contact information: [address] [place] [address] [phone]           Discharge Instructions and Plan: Patient will be discharged to home with home health physical therapy. Will be discharged  on Lovenox for DVT prophylaxis. Patient has all the necessary DME for discharge. Patient will follow up with Dr. Jena Gauss in 2 weeks for repeat x-rays and suture removal.   Signed:  Shawn Route. Ladonna Snide ?[phone]? (phone) 08/17/2019, 2:00 PM  [facility] [address] [place] [address] [phone] (F)

## 2019-08-17 NOTE — Discharge Instructions (Signed)
Orthopaedic Trauma Service Discharge Instructions   General Discharge Instructions  WEIGHT BEARING STATUS: Non-weightbearing left lower extremity  RANGE OF MOTION/ACTIVITY: Okay to unlock hinge brace for unrestricted range of motion of the left knee during the day. Lock brace in full extension at night. Hinge brace should be worn at all times except when showering   Wound Care: Okay to remove surgical dressing on post-op day #2 (Wednesday 08/18/19). Incisions can be left open to air if there is no drainage. If incision continues to have drainage, follow wound care instructions below. Okay to shower if no drainage from incisions.  DVT/PE prophylaxis: Lovenox  Diet: as you were eating previously.  Can use over the counter stool softeners and bowel preparations, such as Miralax, to help with bowel movements.  Narcotics can be constipating.  Be sure to drink plenty of fluids  PAIN MEDICATION USE AND EXPECTATIONS  You have likely been given narcotic medications to help control your pain.  After a traumatic event that results in an fracture (broken bone) with or without surgery, it is ok to use narcotic pain medications to help control one's pain.  We understand that everyone responds to pain differently and each individual patient will be evaluated on a regular basis for the continued need for narcotic medications. Ideally, narcotic medication use should last no more than 6-8 weeks (coinciding with fracture healing).   As a patient it is your responsibility as well to monitor narcotic medication use and report the amount and frequency you use these medications when you come to your office visit.   We would also advise that if you are using narcotic medications, you should take a dose prior to therapy to maximize you participation.  IF YOU ARE ON NARCOTIC MEDICATIONS IT IS NOT PERMISSIBLE TO OPERATE A MOTOR VEHICLE (MOTORCYCLE/CAR/TRUCK/MOPED) OR HEAVY MACHINERY DO NOT MIX NARCOTICS WITH OTHER CNS  (CENTRAL NERVOUS SYSTEM) DEPRESSANTS SUCH AS ALCOHOL   STOP SMOKING OR USING NICOTINE PRODUCTS!!!!  As discussed nicotine severely impairs your body's ability to heal surgical and traumatic wounds but also impairs bone healing.  Wounds and bone heal by forming microscopic blood vessels (angiogenesis) and nicotine is a vasoconstrictor (essentially, shrinks blood vessels).  Therefore, if vasoconstriction occurs to these microscopic blood vessels they essentially disappear and are unable to deliver necessary nutrients to the healing tissue.  This is one modifiable factor that you can do to dramatically increase your chances of healing your injury.    (This means no smoking, no nicotine gum, patches, etc)  DO NOT USE NONSTEROIDAL ANTI-INFLAMMATORY DRUGS (NSAID'S)  Using products such as Advil (ibuprofen), Aleve (naproxen), Motrin (ibuprofen) for additional pain control during fracture healing can delay and/or prevent the healing response.  If you would like to take over the counter (OTC) medication, Tylenol (acetaminophen) is ok.  However, some narcotic medications that are given for pain control contain acetaminophen as well. Therefore, you should not exceed more than 4000 mg of tylenol in a day if you do not have liver disease.  Also note that there are may OTC medicines, such as cold medicines and allergy medicines that my contain tylenol as well.  If you have any questions about medications and/or interactions please ask your doctor/PA or your pharmacist.      ICE AND ELEVATE INJURED/OPERATIVE EXTREMITY  Using ice and elevating the injured extremity above your heart can help with swelling and pain control.  Icing in a pulsatile fashion, such as 20 minutes on and 20 minutes off,  can be followed.    Do not place ice directly on skin. Make sure there is a barrier between to skin and the ice pack.    Using frozen items such as frozen peas works well as the conform nicely to the are that needs to be  iced.  USE AN ACE WRAP OR TED HOSE FOR SWELLING CONTROL  In addition to icing and elevation, Ace wraps or TED hose are used to help limit and resolve swelling.  It is recommended to use Ace wraps or TED hose until you are informed to stop.    When using Ace Wraps start the wrapping distally (farthest away from the body) and wrap proximally (closer to the body)   Example: If you had surgery on your leg or thing and you do not have a splint on, start the ace wrap at the toes and work your way up to the thigh        If you had surgery on your upper extremity and do not have a splint on, start the ace wrap at your fingers and work your way up to the upper arm   [place]: [phone]   VISIT OUR WEBSITE FOR ADDITIONAL INFORMATION: [email or web address]      Discharge Wound Care Instructions  Do NOT apply any ointments, solutions or lotions to pin sites or surgical wounds.  These prevent needed drainage and even though solutions like hydrogen peroxide kill bacteria, they also damage cells lining the pin sites that help fight infection.  Applying lotions or ointments can keep the wounds moist and can cause them to breakdown and open up as well. This can increase the risk for infection. When in doubt call the office.  Surgical incisions should be dressed daily.  If any drainage is noted, use one layer of adaptic, then gauze, Kerlix, and an ace wrap.  Once the incision is completely dry and without drainage, it may be left open to air out.  Showering may begin 36-48 hours later.  Cleaning gently with soap and water.  Traumatic wounds should be dressed daily as well.    One layer of adaptic, gauze, Kerlix, then ace wrap.  The adaptic can be discontinued once the draining has ceased    If you have a wet to dry dressing: wet the gauze with saline the squeeze as much saline out so the gauze is moist (not soaking wet), place moistened gauze over wound, then place a  dry gauze over the moist one, followed by Kerlix wrap, then ace wrap.
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
## 2019-08-18 ENCOUNTER — Encounter: Payer: Self-pay | Admitting: *Deleted

## 2019-08-23 ENCOUNTER — Encounter: Payer: Self-pay | Admitting: Student

## 2019-08-23 DIAGNOSIS — S83282A Other tear of lateral meniscus, current injury, left knee, initial encounter: Secondary | ICD-10-CM | POA: Insufficient documentation

## 2019-08-23 DIAGNOSIS — S82152A Displaced fracture of left tibial tuberosity, initial encounter for closed fracture: Secondary | ICD-10-CM | POA: Insufficient documentation

## 2019-08-23 DIAGNOSIS — Z9889 Other specified postprocedural states: Secondary | ICD-10-CM | POA: Insufficient documentation

## 2019-08-23 DIAGNOSIS — Z8781 Personal history of (healed) traumatic fracture: Secondary | ICD-10-CM | POA: Insufficient documentation

## 2019-08-24 ENCOUNTER — Ambulatory Visit: Payer: No Typology Code available for payment source | Admitting: Sports Medicine

## 2020-02-28 DIAGNOSIS — E230 Hypopituitarism: Secondary | ICD-10-CM

## 2020-02-28 DIAGNOSIS — Z113 Encounter for screening for infections with a predominantly sexual mode of transmission: Secondary | ICD-10-CM

## 2020-02-28 DIAGNOSIS — Z0000 Encounter for general adult medical examination without abnormal findings: Secondary | ICD-10-CM

## 2020-03-06 LAB — COMPREHENSIVE METABOLIC PANEL
AG Ratio: 2 (calc) (ref 1.0–2.5)
ALT: 19 U/L (ref 9–46)
AST: 20 U/L (ref 10–40)
Albumin: 4.7 g/dL (ref 3.6–5.1)
Alkaline phosphatase (APISO): 54 U/L (ref 36–130)
BUN: 12 mg/dL (ref 7–25)
CO2: 29 mmol/L (ref 20–32)
Calcium: 9.8 mg/dL (ref 8.6–10.3)
Chloride: 103 mmol/L (ref 98–110)
Creat: 0.92 mg/dL (ref 0.60–1.35)
Globulin: 2.4 g/dL (calc) (ref 1.9–3.7)
Glucose, Bld: 93 mg/dL (ref 65–99)
Potassium: 4.9 mmol/L (ref 3.5–5.3)
Sodium: 141 mmol/L (ref 135–146)
Total Bilirubin: 0.5 mg/dL (ref 0.2–1.2)
Total Protein: 7.1 g/dL (ref 6.1–8.1)

## 2020-03-06 LAB — PSA, TOTAL AND FREE
PSA, % Free: 60 %
PSA, Free: 0.3 ng/mL
PSA, Total: 0.5 ng/mL

## 2020-03-06 LAB — LIPID PANEL
Cholesterol: 177 mg/dL (ref <200)
HDL: 65 mg/dL (ref >=40)
LDL Cholesterol (Calc): 92 mg/dL (calc)
Non-HDL Cholesterol (Calc): 112 mg/dL (calc) (ref <130)
Total CHOL/HDL Ratio: 2.7 (calc) (ref <5.0)
Triglycerides: 104 mg/dL (ref <150)

## 2020-03-06 LAB — C. TRACHOMATIS/N. GONORRHOEAE RNA
C. trachomatis RNA, TMA: NOT DETECTED
N. gonorrhoeae RNA, TMA: NOT DETECTED

## 2020-03-06 LAB — CBC
HCT: 50.6 % — ABNORMAL HIGH (ref 38.5–50.0)
Hemoglobin: 17.1 g/dL (ref 13.2–17.1)
MCH: 32 pg (ref 27.0–33.0)
MCHC: 33.8 g/dL (ref 32.0–36.0)
MCV: 94.8 fL (ref 80.0–100.0)
MPV: 9.2 fL (ref 7.5–12.5)
Platelets: 238 Thousand/uL (ref 140–400)
RBC: 5.34 Million/uL (ref 4.20–5.80)
RDW: 12.4 % (ref 11.0–15.0)
WBC: 5 Thousand/uL (ref 3.8–10.8)

## 2020-03-06 LAB — HEPATITIS PANEL, ACUTE
Hep A IgM: NONREACTIVE
Hep B C IgM: NONREACTIVE
Hepatitis B Surface Ag: NONREACTIVE
Hepatitis C Ab: NONREACTIVE
SIGNAL TO CUT-OFF: 0.01

## 2020-03-06 LAB — TESTOSTERONE, FREE & TOTAL
Free Testosterone: 176.2 pg/mL — ABNORMAL HIGH (ref 35.0–155.0)
Testosterone, Total, LC-MS-MS: 776 ng/dL (ref 250–1100)

## 2020-03-06 LAB — RPR: RPR Ser Ql: NONREACTIVE

## 2020-03-06 LAB — HIV ANTIBODY (ROUTINE TESTING W REFLEX): HIV 1&2 Ab, 4th Generation: NONREACTIVE

## 2020-03-06 LAB — TSH: TSH: 2.15 mIU/L (ref 0.40–4.50)

## 2020-04-18 ENCOUNTER — Other Ambulatory Visit: Payer: Self-pay | Admitting: Sports Medicine

## 2020-06-29 DIAGNOSIS — N529 Male erectile dysfunction, unspecified: Secondary | ICD-10-CM

## 2020-07-11 MED ORDER — TADALAFIL 5 MG PO TABS: 5.0000 mg | ORAL_TABLET | Freq: Every day | ORAL | 11 refills | Status: DC | PRN

## 2020-07-17 MED ORDER — TADALAFIL 10 MG PO TABS: 10.0000 mg | ORAL_TABLET | Freq: Every day | ORAL | 11 refills | Status: DC | PRN

## 2020-10-27 ENCOUNTER — Encounter: Payer: Self-pay | Admitting: Sports Medicine

## 2020-10-27 ENCOUNTER — Other Ambulatory Visit: Payer: Self-pay

## 2020-10-27 ENCOUNTER — Other Ambulatory Visit: Payer: Self-pay | Admitting: Sports Medicine

## 2020-10-27 ENCOUNTER — Ambulatory Visit (INDEPENDENT_AMBULATORY_CARE_PROVIDER_SITE_OTHER): Payer: 59 | Admitting: Sports Medicine

## 2020-10-27 VITALS — BP 130/82 | HR 73 | Ht 75.0 in | Wt 254.0 lb

## 2020-10-27 DIAGNOSIS — E230 Hypopituitarism: Secondary | ICD-10-CM

## 2020-10-27 DIAGNOSIS — E6609 Other obesity due to excess calories: Secondary | ICD-10-CM | POA: Diagnosis not present

## 2020-10-27 DIAGNOSIS — E66811 Obesity, class 1: Secondary | ICD-10-CM

## 2020-10-27 DIAGNOSIS — Z113 Encounter for screening for infections with a predominantly sexual mode of transmission: Secondary | ICD-10-CM | POA: Diagnosis not present

## 2020-10-27 DIAGNOSIS — Z9889 Other specified postprocedural states: Secondary | ICD-10-CM | POA: Diagnosis not present

## 2020-10-27 DIAGNOSIS — Z8781 Personal history of (healed) traumatic fracture: Secondary | ICD-10-CM

## 2020-10-27 DIAGNOSIS — Z0000 Encounter for general adult medical examination without abnormal findings: Secondary | ICD-10-CM

## 2020-10-27 DIAGNOSIS — Z6830 Body mass index (BMI) 30.0-30.9, adult: Secondary | ICD-10-CM

## 2020-10-27 MED ORDER — AMBULATORY NON FORMULARY MEDICATION: 0 refills | Status: AC

## 2020-10-27 MED ORDER — PHENTERMINE HCL 37.5 MG PO CAPS: 37.5000 mg | ORAL_CAPSULE | ORAL | 0 refills | Status: DC

## 2020-10-27 NOTE — Assessment & Plan Note (Signed)
Fracture is well reduced, Jeffery Cooley does have some chronic swelling in his left lower extremity which I have advised him will probably be long-term. Will do lower extremity compression stockings, he does have some varicose veins on the contralateral side as well.

## 2020-10-27 NOTE — Assessment & Plan Note (Signed)
Annual physical exam as above. Up-to-date on screenings. Ordering routine labs.

## 2020-10-27 NOTE — Progress Notes (Signed)
Subjective:    CC: Annual Physical Exam  HPI:  This patient is here for their annual physical  I reviewed the past medical history, family history, social history, surgical history, and allergies today and no changes were needed.  Please see the problem list section below in epic for further details.  Past Medical History: Past Medical History:  Diagnosis Date   Pneumonia    " as a kid"   Tibial plateau fracture, left    bicondylar   Past Surgical History: Past Surgical History:  Procedure Laterality Date   ARTERY REPAIR     right wrist   EXTERNAL FIXATION LEG Left 08/03/2019   Procedure: EXTERNAL FIXATION LEG;  Surgeon: Toni Arthurs, MD;  Location: [facility] OR;  Service: Orthopedics;  Laterality: Left;   EXTERNAL FIXATION LEG Left 08/04/2019   Procedure: ADJUSTMENT OF EXTERNAL FIXATION;  Surgeon: Roby Lofts, MD;  Location: [facility] OR;  Service: Orthopedics;  Laterality: Left;   EXTERNAL FIXATION REMOVAL Left 08/16/2019   Procedure: REMOVAL EXTERNAL FIXATION LEG;  Surgeon: Roby Lofts, MD;  Location: [facility] OR;  Service: Orthopedics;  Laterality: Left;   HERNIA REPAIR     ORIF TIBIA PLATEAU Left 08/17/2019   OPEN REDUCTION INTERNAL FIXATION (ORIF) BICONDYLAR TIBIAL PLATEAU (Left )   ORIF TIBIA PLATEAU Left 08/16/2019   Procedure: OPEN REDUCTION INTERNAL FIXATION (ORIF) BICONDYLAR TIBIAL PLATEAU;  Surgeon: Roby Lofts, MD;  Location: [facility] OR;  Service: Orthopedics;  Laterality: Left;   WISDOM TOOTH EXTRACTION     Social History: Social History   Socioeconomic History   Marital status: Divorced    Spouse name: Not on file   Number of children: 2   Years of education: 11   Highest education level: GED or equivalent  Occupational History   Occupation: Buyer, retail    Comment: self-employed  Tobacco Use   Smoking status: Every Day    Pack years: 0.00    Types: Cigars   Smokeless tobacco: Never   Tobacco comments:    daily cigars   Vaping Use   Vaping Use: Some days    Substances: Nicotine, Flavoring  Substance and Sexual Activity   Alcohol use: Yes    Alcohol/week: 28.0 standard drinks    Types: 28 Shots of liquor per week    Comment: 1  finger bourbons per night   Drug use: No   Sexual activity: Yes  Other Topics Concern   Not on file  Social History Narrative   Not on file   Social Determinants of Health   Financial Resource Strain: Not on file  Food Insecurity: Not on file  Transportation Needs: Not on file  Physical Activity: Not on file  Stress: Not on file  Social Connections: Not on file   Family History: Family History  Problem Relation Age of Onset   Infertility Other    Allergies: Allergies  Allergen Reactions   Ceclor [Cefaclor]    Medications: See med rec.  Review of Systems: No headache, visual changes, nausea, vomiting, diarrhea, constipation, dizziness, abdominal pain, skin rash, fevers, chills, night sweats, swollen lymph nodes, weight loss, chest pain, body aches, joint swelling, muscle aches, shortness of breath, mood changes, visual or auditory hallucinations.  Objective:    General: Well Developed, well nourished, and in no acute distress.  Neuro: Alert and oriented x3, extra-ocular muscles intact, sensation grossly intact. Cranial nerves II through XII are intact, motor, sensory, and coordinative functions are all intact. HEENT: Normocephalic, atraumatic, pupils equal round reactive  to light, neck supple, no masses, no lymphadenopathy, thyroid nonpalpable. Oropharynx, nasopharynx, external ear canals are unremarkable. Skin: Warm and dry, no rashes noted.  Cardiac: Regular rate and rhythm, no murmurs rubs or gallops.  Respiratory: Clear to auscultation bilaterally. Not using accessory muscles, speaking in full sentences.  Abdominal: Soft, nontender, nondistended, positive bowel sounds, no masses, no organomegaly.  Musculoskeletal: Shoulder, elbow, wrist, hip, knee, ankle stable, and with full range of motion.   Varicose veins, reticular veins, spider veins bilateral lower extremity, chronic left lower extremity swelling with well-healed ORIF scars, negative Homans' sign bilaterally.  Impression and Recommendations:    The patient was counselled, risk factors were discussed, anticipatory guidance given.  Annual physical exam Annual physical exam as above. Up-to-date on screenings. Ordering routine labs.   S/P ORIF (open reduction internal fixation) fracture left tibial plateau Fracture is well reduced, Joe does have some chronic swelling in his left lower extremity which I have advised him will probably be long-term. Will do lower extremity compression stockings, he does have some varicose veins on the contralateral side as well.  Screening for STD (sexually transmitted disease) Full STD screen  Obesity Gained some weight, restarting phentermine, return monthly for weight checks and refills.  Hypogonadotropic hypogonadism (HCC) 6 months ago testosterone levels look good, rechecking.   ___________________________________________ [name]. Benjamin Stain, M.D., ABFM., CAQSM. Primary Care and Sports Medicine [facility] [facility]  Adjunct Professor of Family Medicine  [employer]

## 2020-10-27 NOTE — Assessment & Plan Note (Signed)
6 months ago testosterone levels look good, rechecking.

## 2020-10-27 NOTE — Assessment & Plan Note (Signed)
Full STD screen

## 2020-10-27 NOTE — Assessment & Plan Note (Signed)
Gained some weight, restarting phentermine, return monthly for weight checks and refills.

## 2020-10-31 LAB — HIV ANTIBODY (ROUTINE TESTING W REFLEX): HIV 1&2 Ab, 4th Generation: NONREACTIVE

## 2020-10-31 LAB — C. TRACHOMATIS/N. GONORRHOEAE RNA
C. trachomatis RNA, TMA: NOT DETECTED
N. gonorrhoeae RNA, TMA: NOT DETECTED

## 2020-10-31 LAB — LIPID PANEL
Cholesterol: 186 mg/dL (ref <200)
HDL: 52 mg/dL (ref >=40)
LDL Cholesterol (Calc): 115 mg/dL (calc) — ABNORMAL HIGH
Non-HDL Cholesterol (Calc): 134 mg/dL (calc) — ABNORMAL HIGH (ref <130)
Total CHOL/HDL Ratio: 3.6 (calc) (ref <5.0)
Triglycerides: 86 mg/dL (ref <150)

## 2020-10-31 LAB — COMPREHENSIVE METABOLIC PANEL
AG Ratio: 2 (calc) (ref 1.0–2.5)
ALT: 31 U/L (ref 9–46)
AST: 26 U/L (ref 10–40)
Albumin: 4.7 g/dL (ref 3.6–5.1)
Alkaline phosphatase (APISO): 49 U/L (ref 36–130)
BUN: 10 mg/dL (ref 7–25)
CO2: 29 mmol/L (ref 20–32)
Calcium: 9.8 mg/dL (ref 8.6–10.3)
Chloride: 104 mmol/L (ref 98–110)
Creat: 0.97 mg/dL (ref 0.60–1.35)
Globulin: 2.3 g/dL (calc) (ref 1.9–3.7)
Glucose, Bld: 91 mg/dL (ref 65–99)
Potassium: 4.6 mmol/L (ref 3.5–5.3)
Sodium: 142 mmol/L (ref 135–146)
Total Bilirubin: 0.6 mg/dL (ref 0.2–1.2)
Total Protein: 7 g/dL (ref 6.1–8.1)

## 2020-10-31 LAB — TSH: TSH: 1.8 mIU/L (ref 0.40–4.50)

## 2020-10-31 LAB — PSA, TOTAL AND FREE
PSA, % Free: 33 % (calc) (ref >25)
PSA, Free: 0.2 ng/mL
PSA, Total: 0.6 ng/mL (ref <=4.0)

## 2020-10-31 LAB — HEPATITIS PANEL, ACUTE
Hep A IgM: NONREACTIVE
Hep B C IgM: NONREACTIVE
Hepatitis B Surface Ag: NONREACTIVE
Hepatitis C Ab: NONREACTIVE
SIGNAL TO CUT-OFF: 0.03 (ref <1.00)

## 2020-10-31 LAB — CBC
HCT: 50.1 % — ABNORMAL HIGH (ref 38.5–50.0)
Hemoglobin: 16.9 g/dL (ref 13.2–17.1)
MCH: 31.8 pg (ref 27.0–33.0)
MCHC: 33.7 g/dL (ref 32.0–36.0)
MCV: 94.4 fL (ref 80.0–100.0)
MPV: 9 fL (ref 7.5–12.5)
Platelets: 206 10*3/uL (ref 140–400)
RBC: 5.31 10*6/uL (ref 4.20–5.80)
RDW: 11.7 % (ref 11.0–15.0)
WBC: 4.7 10*3/uL (ref 3.8–10.8)

## 2020-10-31 LAB — TESTOSTERONE, FREE & TOTAL
Free Testosterone: 102 pg/mL (ref 35.0–155.0)
Testosterone, Total, LC-MS-MS: 638 ng/dL (ref 250–1100)

## 2020-10-31 LAB — RPR: RPR Ser Ql: NONREACTIVE

## 2020-11-24 ENCOUNTER — Other Ambulatory Visit: Payer: Self-pay

## 2020-11-24 ENCOUNTER — Encounter: Payer: Self-pay | Admitting: Sports Medicine

## 2020-11-24 ENCOUNTER — Ambulatory Visit (INDEPENDENT_AMBULATORY_CARE_PROVIDER_SITE_OTHER): Payer: 59 | Admitting: Sports Medicine

## 2020-11-24 DIAGNOSIS — E6609 Other obesity due to excess calories: Secondary | ICD-10-CM | POA: Diagnosis not present

## 2020-11-24 DIAGNOSIS — Z6830 Body mass index (BMI) 30.0-30.9, adult: Secondary | ICD-10-CM

## 2020-11-24 MED ORDER — PHENTERMINE HCL 37.5 MG PO CAPS: 37.5000 mg | ORAL_CAPSULE | ORAL | 0 refills | Status: DC

## 2020-11-24 NOTE — Progress Notes (Signed)
    Procedures performed today:    None.  Independent interpretation of notes and tests performed by another provider:   None.  Brief History, Exam, Impression, and Recommendations:    Jeffery Cooley returns, after the first month on phentermine he has lost 10 pounds, aiming for at least another 20 pound weight loss, return to see me in a month, entering the second month.    ___________________________________________ [name]. Benjamin Stain, M.D., ABFM., CAQSM. Primary Care and Sports Medicine [place] [facility]  Adjunct Instructor of Family Medicine  [employer]

## 2020-11-24 NOTE — Assessment & Plan Note (Signed)
Jeffery Cooley returns, after the first month on phentermine he has lost 10 pounds, aiming for at least another 20 pound weight loss, return to see me in a month, entering the second month.

## 2020-12-22 ENCOUNTER — Encounter: Payer: Self-pay | Admitting: Sports Medicine

## 2020-12-22 ENCOUNTER — Ambulatory Visit (INDEPENDENT_AMBULATORY_CARE_PROVIDER_SITE_OTHER): Payer: 59 | Admitting: Sports Medicine

## 2020-12-22 ENCOUNTER — Other Ambulatory Visit: Payer: Self-pay

## 2020-12-22 DIAGNOSIS — Z6830 Body mass index (BMI) 30.0-30.9, adult: Secondary | ICD-10-CM

## 2020-12-22 DIAGNOSIS — E6609 Other obesity due to excess calories: Secondary | ICD-10-CM | POA: Diagnosis not present

## 2020-12-22 DIAGNOSIS — E66811 Obesity, class 1: Secondary | ICD-10-CM

## 2020-12-22 MED ORDER — PHENTERMINE HCL 37.5 MG PO CAPS: 37.5000 mg | ORAL_CAPSULE | ORAL | 0 refills | Status: DC

## 2020-12-22 NOTE — Progress Notes (Signed)
    Procedures performed today:    None.  Independent interpretation of notes and tests performed by another provider:   None.  Brief History, Exam, Impression, and Recommendations:    Obesity Joe returns, we are entering the third month, he lost another 5 pounds, goal is 215 to 220 pounds. Refilling phentermine, no adverse effects noted.    ___________________________________________ [name]. Benjamin Stain, M.D., ABFM., CAQSM. Primary Care and Sports Medicine [place] [facility]  Adjunct Instructor of Family Medicine  [employer]

## 2020-12-22 NOTE — Assessment & Plan Note (Signed)
Jeffery Cooley returns, we are entering the third month, he lost another 5 pounds, goal is 215 to 220 pounds. Refilling phentermine, no adverse effects noted.

## 2021-01-22 ENCOUNTER — Ambulatory Visit (INDEPENDENT_AMBULATORY_CARE_PROVIDER_SITE_OTHER): Payer: 59 | Admitting: Sports Medicine

## 2021-01-22 ENCOUNTER — Encounter: Payer: Self-pay | Admitting: Sports Medicine

## 2021-01-22 DIAGNOSIS — E66811 Obesity, class 1: Secondary | ICD-10-CM

## 2021-01-22 DIAGNOSIS — E6609 Other obesity due to excess calories: Secondary | ICD-10-CM

## 2021-01-22 DIAGNOSIS — Z6830 Body mass index (BMI) 30.0-30.9, adult: Secondary | ICD-10-CM

## 2021-01-22 MED ORDER — PHENTERMINE HCL 37.5 MG PO TABS
ORAL_TABLET | ORAL | 0 refills | Status: DC
Start: 2021-01-22 — End: 2021-02-19

## 2021-01-22 NOTE — Assessment & Plan Note (Signed)
Jeffery Cooley returns, we are entering the fourth month, he gained a couple of pounds over the last month, will continue phentermine and give him another chance this month, if insufficient improvement over the next month we will add topiramate. Goal weight is 215 to 220 pounds.

## 2021-01-22 NOTE — Progress Notes (Signed)
    Procedures performed today:    None.  Independent interpretation of notes and tests performed by another provider:   None.  Brief History, Exam, Impression, and Recommendations:    Jeffery Cooley returns, we are entering the fourth month, he gained a couple of pounds over the last month, will continue phentermine and give him another chance this month, if insufficient improvement over the next month we will add topiramate. Goal weight is 215 to 220 pounds.    ___________________________________________ [name]. Benjamin Stain, M.D., ABFM., CAQSM. Primary Care and Sports Medicine [place] [facility]  Adjunct Instructor of Family Medicine  [employer] [facility]

## 2021-02-19 ENCOUNTER — Other Ambulatory Visit: Payer: Self-pay

## 2021-02-19 ENCOUNTER — Ambulatory Visit (INDEPENDENT_AMBULATORY_CARE_PROVIDER_SITE_OTHER): Payer: 59 | Admitting: Sports Medicine

## 2021-02-19 DIAGNOSIS — Z6830 Body mass index (BMI) 30.0-30.9, adult: Secondary | ICD-10-CM

## 2021-02-19 DIAGNOSIS — E6609 Other obesity due to excess calories: Secondary | ICD-10-CM

## 2021-02-19 DIAGNOSIS — E66811 Obesity, class 1: Secondary | ICD-10-CM

## 2021-02-19 MED ORDER — MELOXICAM 15 MG PO TABS: ORAL_TABLET | ORAL | 3 refills | Status: DC

## 2021-02-19 MED ORDER — PHENTERMINE HCL 37.5 MG PO TABS: ORAL_TABLET | ORAL | 0 refills | Status: DC

## 2021-02-19 NOTE — Assessment & Plan Note (Signed)
Jeffery Cooley is entering the fifth month of phentermine, he has still gained some weight, though he is wearing significantly more clothing. Goal weight continues to be 215 to 220 pounds. He would like to try 1 more month before adding topiramate.

## 2021-02-19 NOTE — Progress Notes (Signed)
    Procedures performed today:    None.  Independent interpretation of notes and tests performed by another provider:   None.  Brief History, Exam, Impression, and Recommendations:    Jeffery Cooley is entering the fifth month of phentermine, he has still gained some weight, though he is wearing significantly more clothing. Goal weight continues to be 215 to 220 pounds. He would like to try 1 more month before adding topiramate.    ___________________________________________ Jeffery Cooley. Benjamin Stain, M.D., ABFM., CAQSM. Primary Care and Sports Medicine [place] [facility]  Adjunct Instructor of Family Medicine  [employer]

## 2021-02-22 ENCOUNTER — Telehealth: Payer: Self-pay

## 2021-02-22 DIAGNOSIS — N529 Male erectile dysfunction, unspecified: Secondary | ICD-10-CM

## 2021-02-22 NOTE — Telephone Encounter (Signed)
Pharmacy called stating that they had 2 RXs on file for tadalifil, one for 10mg  and one for 5mg .  Pt has requested both RXs for the last several months and pharmacy has declined to fill the 5mg  tablets.  Pharmacy called wanting to verify pt's current dose for this medication.  Advised pharmacist that pt is to only be taking 10mg  and not an additional 5mg  tablet per Dr. note.  , MD to     8:44 PM Joe I'm sorry, I've been out of town without access to the system but I'm back in action! I sent in the 10mg  pills, just double the 5mg  pills if you already bought them and then go refill with the 10s when you run out! ___________________________________________ . , M.D., ABFM., CAQSM. Primary Care and Sports Medicine [place] [facility]   Adjunct Instructor of Family Medicine  [employer]  Advised pharmacist to have pt call the office if he has any questions.  Coralie Carpen, CMA

## 2021-02-22 NOTE — Telephone Encounter (Deleted)
Pharmacy called stating that they had 2 RXs on file for tadalis, one for 10mg  and one for 5mg .  Pt has requested both RXs for the last several months and pharmacy has declined to fill the 5mg  tablets.  Pharmacy called wanting to verify pt's current dose for this medication.  Advised pharmacist that pt is to only be taking 10mg  and not an additional 5mg  tablet per Dr. note.  , MD to     8:44 PM Joe I'm sorry, I've been out of town without access to the system but I'm back in action! I sent in the 10mg  pills, just double the 5mg  pills if you already bought them and then go refill with the 10s when you run out! ___________________________________________ . , M.D., ABFM., CAQSM. Primary Care and Sports Medicine [place] [facility]   Adjunct Instructor of Family Medicine  [employer]  Advised pharmacist to have pt call the office if he has any questions.  Coralie Carpen, CMA

## 2021-02-23 MED ORDER — TADALAFIL 5 MG PO TABS: 5.0000 mg | ORAL_TABLET | Freq: Every day | ORAL | 11 refills | Status: DC

## 2021-02-23 MED ORDER — TADALAFIL 10 MG PO TABS: 10.0000 mg | ORAL_TABLET | Freq: Every day | ORAL | 11 refills | Status: DC | PRN

## 2021-02-23 NOTE — Assessment & Plan Note (Signed)
Jeffery Cooley is doing well with tadalafil, he does 5 mg daily and 10 mg prior to sexual activity.

## 2021-02-23 NOTE — Addendum Note (Signed)
Addended by: Monica Becton on: 02/23/2021 04:43 PM   Modules accepted: Orders

## 2021-02-27 ENCOUNTER — Telehealth: Payer: Self-pay

## 2021-02-27 MED ORDER — TADALAFIL 10 MG PO TABS: 10.0000 mg | ORAL_TABLET | Freq: Every day | ORAL | 11 refills | Status: DC | PRN

## 2021-02-27 MED ORDER — TADALAFIL 5 MG PO TABS: 5.0000 mg | ORAL_TABLET | Freq: Every day | ORAL | 11 refills | Status: DC

## 2021-02-27 NOTE — Telephone Encounter (Signed)
[facility] states they can only fill the Cialis 5 mg or the Cialis 10 mg they will not fill both.

## 2021-02-27 NOTE — Addendum Note (Signed)
Addended by: Monica Becton on: 02/27/2021 02:19 PM   Modules accepted: Orders

## 2021-03-20 ENCOUNTER — Other Ambulatory Visit: Payer: Self-pay

## 2021-03-20 ENCOUNTER — Ambulatory Visit (INDEPENDENT_AMBULATORY_CARE_PROVIDER_SITE_OTHER): Payer: 59 | Admitting: Sports Medicine

## 2021-03-20 DIAGNOSIS — Z6830 Body mass index (BMI) 30.0-30.9, adult: Secondary | ICD-10-CM

## 2021-03-20 DIAGNOSIS — E6609 Other obesity due to excess calories: Secondary | ICD-10-CM

## 2021-03-20 DIAGNOSIS — E66811 Obesity, class 1: Secondary | ICD-10-CM

## 2021-03-20 MED ORDER — PHENTERMINE HCL 37.5 MG PO TABS: ORAL_TABLET | ORAL | 0 refills | Status: DC

## 2021-03-20 MED ORDER — TADALAFIL 5 MG PO TABS: 5.0000 mg | ORAL_TABLET | Freq: Every day | ORAL | 3 refills | Status: DC

## 2021-03-20 NOTE — Assessment & Plan Note (Signed)
Joe returns, he has now finished 6 months of phentermine, we switched to tablets and he lost additional weight, goal continues to be 215 to 220 pounds, we will do 3 months of half dose phentermine and a taper, and if he starts to gain we can add topiramate. Return to see me in 3 months.

## 2021-03-20 NOTE — Progress Notes (Signed)
    Procedures performed today:    None.  Independent interpretation of notes and tests performed by another provider:   None.  Brief History, Exam, Impression, and Recommendations:    Jeffery Cooley returns, he has now finished 6 months of phentermine, we switched to tablets and he lost additional weight, goal continues to be 215 to 220 pounds, we will do 3 months of half dose phentermine and a taper, and if he starts to gain we can add topiramate. Return to see me in 3 months.    ___________________________________________ Jeffery Cooley. Benjamin Stain, M.D., ABFM., CAQSM. Primary Care and Sports Medicine [place] [facility]  Adjunct Instructor of Family Medicine  [employer]

## 2021-04-19 ENCOUNTER — Encounter: Payer: Self-pay | Admitting: Sports Medicine

## 2021-04-19 ENCOUNTER — Other Ambulatory Visit: Payer: Self-pay | Admitting: Sports Medicine

## 2021-04-20 MED ORDER — CLOMIPHENE CITRATE 50 MG PO TABS
25.0000 mg | ORAL_TABLET | Freq: Every day | ORAL | 3 refills | Status: DC
  Filled 2022-01-31: qty 45, 90d supply, fill #0

## 2021-06-20 ENCOUNTER — Ambulatory Visit: Payer: No Typology Code available for payment source | Admitting: Sports Medicine

## 2021-06-20 ENCOUNTER — Other Ambulatory Visit: Payer: Self-pay

## 2021-06-20 DIAGNOSIS — E66811 Obesity, class 1: Secondary | ICD-10-CM

## 2021-06-20 DIAGNOSIS — E6609 Other obesity due to excess calories: Secondary | ICD-10-CM

## 2021-06-20 DIAGNOSIS — Z6830 Body mass index (BMI) 30.0-30.9, adult: Secondary | ICD-10-CM | POA: Diagnosis not present

## 2021-06-20 MED ORDER — TOPIRAMATE 50 MG PO TABS: ORAL_TABLET | ORAL | 0 refills | Status: DC

## 2021-06-20 MED ORDER — PHENTERMINE HCL 37.5 MG PO TABS: ORAL_TABLET | ORAL | 0 refills | Status: DC

## 2021-06-20 NOTE — Progress Notes (Signed)
° ° °  Procedures performed today:    None.  Independent interpretation of notes and tests performed by another provider:   None.  Brief History, Exam, Impression, and Recommendations:    Jeffery Cooley returns, we have been treating him for weight loss, he lost significant weight on full dose phentermine after the first 6 months, we dropped to a half tab and he has unfortunately gained over 10 pounds. I will refill half dose phentermine, and we will add Topamax, he is in the middle of a large business transaction so he will be able to start Topamax for the next week or 2 considering some of the cognitive side effects, afterwards he will start it and we can touch base again in 3 months. If he continues to gain weight we will need to consider a GLP-1.    ___________________________________________ Gwen Her. Dianah Field, M.D., ABFM., CAQSM. Primary Care and [place] Instructor of [place] [employer] [facility] [employer]

## 2021-06-20 NOTE — Assessment & Plan Note (Signed)
Jeffery Cooley returns, we have been treating him for weight loss, he lost significant weight on full dose phentermine after the first 6 months, we dropped to a half tab and he has unfortunately gained over 10 pounds. I will refill half dose phentermine, and we will add Topamax, he is in the middle of a large business transaction so he will be able to start Topamax for the next week or 2 considering some of the cognitive side effects, afterwards he will start it and we can touch base again in 3 months. If he continues to gain weight we will need to consider a GLP-1.

## 2021-08-14 ENCOUNTER — Ambulatory Visit: Payer: No Typology Code available for payment source | Admitting: Sports Medicine

## 2021-08-14 ENCOUNTER — Encounter: Payer: Self-pay | Admitting: Sports Medicine

## 2021-08-14 ENCOUNTER — Ambulatory Visit (INDEPENDENT_AMBULATORY_CARE_PROVIDER_SITE_OTHER): Payer: No Typology Code available for payment source

## 2021-08-14 DIAGNOSIS — M7989 Other specified soft tissue disorders: Secondary | ICD-10-CM

## 2021-08-14 DIAGNOSIS — M79662 Pain in left lower leg: Secondary | ICD-10-CM

## 2021-08-14 DIAGNOSIS — I872 Venous insufficiency (chronic) (peripheral): Secondary | ICD-10-CM | POA: Insufficient documentation

## 2021-08-14 DIAGNOSIS — N529 Male erectile dysfunction, unspecified: Secondary | ICD-10-CM | POA: Diagnosis not present

## 2021-08-14 MED ORDER — CLOTRIMAZOLE-BETAMETHASONE 1-0.05 % EX CREA: 1.0000 "application " | TOPICAL_CREAM | Freq: Two times a day (BID) | CUTANEOUS | 0 refills | Status: AC

## 2021-08-14 MED ORDER — TADALAFIL 10 MG PO TABS: 10.0000 mg | ORAL_TABLET | Freq: Every day | ORAL | 3 refills | Status: AC | PRN

## 2021-08-14 MED ORDER — DOXYCYCLINE HYCLATE 100 MG PO TABS: 100.0000 mg | ORAL_TABLET | Freq: Two times a day (BID) | ORAL | 0 refills | Status: AC
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
## 2021-08-28 ENCOUNTER — Ambulatory Visit (INDEPENDENT_AMBULATORY_CARE_PROVIDER_SITE_OTHER): Payer: No Typology Code available for payment source | Admitting: Sports Medicine

## 2021-08-28 ENCOUNTER — Encounter: Payer: Self-pay | Admitting: Sports Medicine

## 2021-08-28 DIAGNOSIS — I872 Venous insufficiency (chronic) (peripheral): Secondary | ICD-10-CM | POA: Diagnosis not present
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
## 2021-09-06 ENCOUNTER — Encounter: Payer: Self-pay | Admitting: Sports Medicine

## 2021-09-06 DIAGNOSIS — I872 Venous insufficiency (chronic) (peripheral): Secondary | ICD-10-CM

## 2021-09-07 ENCOUNTER — Ambulatory Visit (INDEPENDENT_AMBULATORY_CARE_PROVIDER_SITE_OTHER): Payer: No Typology Code available for payment source | Admitting: Sports Medicine

## 2021-09-07 VITALS — Ht 75.0 in | Wt 252.0 lb

## 2021-09-07 DIAGNOSIS — I872 Venous insufficiency (chronic) (peripheral): Secondary | ICD-10-CM

## 2021-09-07 NOTE — Telephone Encounter (Signed)
I spent 5 total minutes of online digital evaluation and management services in this patient-initiated request for online care. 

## 2021-09-07 NOTE — Progress Notes (Signed)
Pt presented for unna boot of left leg. Return x 1 week. ?

## 2021-09-07 NOTE — Telephone Encounter (Signed)
Patient scheduled.
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
## 2021-09-10 ENCOUNTER — Ambulatory Visit (INDEPENDENT_AMBULATORY_CARE_PROVIDER_SITE_OTHER): Payer: No Typology Code available for payment source | Admitting: Sports Medicine

## 2021-09-10 DIAGNOSIS — I872 Venous insufficiency (chronic) (peripheral): Secondary | ICD-10-CM | POA: Diagnosis not present

## 2021-09-10 MED ORDER — SULFAMETHOXAZOLE-TRIMETHOPRIM 800-160 MG PO TABS: 1.0000 | ORAL_TABLET | Freq: Two times a day (BID) | ORAL | 0 refills | Status: DC
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
## 2021-09-21 ENCOUNTER — Ambulatory Visit: Payer: No Typology Code available for payment source | Admitting: Sports Medicine

## 2021-09-21 ENCOUNTER — Encounter: Payer: Self-pay | Admitting: Sports Medicine

## 2021-09-21 DIAGNOSIS — E6609 Other obesity due to excess calories: Secondary | ICD-10-CM

## 2021-09-21 DIAGNOSIS — Z6830 Body mass index (BMI) 30.0-30.9, adult: Secondary | ICD-10-CM

## 2021-09-21 DIAGNOSIS — I872 Venous insufficiency (chronic) (peripheral): Secondary | ICD-10-CM | POA: Diagnosis not present

## 2021-09-21 DIAGNOSIS — E66811 Obesity, class 1: Secondary | ICD-10-CM

## 2021-09-21 MED ORDER — TOPIRAMATE 50 MG PO TABS: ORAL_TABLET | ORAL | 0 refills | Status: DC

## 2021-09-21 MED ORDER — PHENTERMINE HCL 37.5 MG PO TABS
ORAL_TABLET | ORAL | 0 refills | Status: DC
Start: 2021-09-21 — End: 2022-01-16
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
## 2021-09-21 NOTE — Assessment & Plan Note (Signed)
7 additional pounds over the last month, refilling phentermine, half tabs daily for 5-month supply. Refilling Topamax as well.

## 2021-09-28 ENCOUNTER — Ambulatory Visit (INDEPENDENT_AMBULATORY_CARE_PROVIDER_SITE_OTHER): Payer: Self-pay | Admitting: Sports Medicine

## 2021-09-28 DIAGNOSIS — I872 Venous insufficiency (chronic) (peripheral): Secondary | ICD-10-CM

## 2021-09-28 NOTE — Progress Notes (Signed)
Patient in office for nurse visit for unna boot replacement. Patient came in office with Unna boot removed already. Unna boot placed on patients left leg.

## 2021-10-05 ENCOUNTER — Ambulatory Visit (INDEPENDENT_AMBULATORY_CARE_PROVIDER_SITE_OTHER): Payer: Self-pay | Admitting: Sports Medicine

## 2021-10-05 DIAGNOSIS — I872 Venous insufficiency (chronic) (peripheral): Secondary | ICD-10-CM

## 2021-10-05 NOTE — Progress Notes (Signed)
Placed unna boot on left leg injury. Pt to return in 1 week for replacement.

## 2021-10-12 ENCOUNTER — Ambulatory Visit (INDEPENDENT_AMBULATORY_CARE_PROVIDER_SITE_OTHER): Payer: Commercial Managed Care - HMO | Admitting: Medical-Surgical

## 2021-10-12 VITALS — Ht 75.0 in | Wt 252.0 lb

## 2021-10-12 DIAGNOSIS — I872 Venous insufficiency (chronic) (peripheral): Secondary | ICD-10-CM

## 2021-10-12 NOTE — Progress Notes (Signed)
Left leg Unna boot placed by MA.  Return as instructed in 1 week for replacement. ___________________________________________ Thayer Ohm, DNP, APRN, FNP-BC Primary Care and Sports Medicine [facility] [place]

## 2021-10-19 ENCOUNTER — Ambulatory Visit (INDEPENDENT_AMBULATORY_CARE_PROVIDER_SITE_OTHER): Payer: Commercial Managed Care - HMO | Admitting: Sports Medicine

## 2021-10-19 ENCOUNTER — Other Ambulatory Visit: Payer: Self-pay | Admitting: *Deleted

## 2021-10-19 DIAGNOSIS — I872 Venous insufficiency (chronic) (peripheral): Secondary | ICD-10-CM

## 2021-10-19 NOTE — Progress Notes (Signed)
Pt here for Unna boot placement on L leg. Pt reports that he feels that he is healing well and wanted to know if he needed to actually have another wrap done today.  Dr. Benjamin Stain came in to look at pt's leg and advised that he have his leg wrapped for another week and then he can discontinue. Was advised of precautions. Pt voiced understanding and agreed.

## 2021-11-06 ENCOUNTER — Ambulatory Visit: Payer: Commercial Managed Care - HMO | Admitting: Vascular Surgery

## 2021-11-06 ENCOUNTER — Ambulatory Visit (HOSPITAL_COMMUNITY)
Admission: RE | Admit: 2021-11-06 | Discharge: 2021-11-06 | Disposition: A | Discharge status: Home / Self Care | Payer: Commercial Managed Care - HMO | Source: Ambulatory Visit | Attending: Vascular Surgery | Admitting: Vascular Surgery

## 2021-11-06 ENCOUNTER — Encounter: Payer: Self-pay | Admitting: Vascular Surgery

## 2021-11-06 DIAGNOSIS — I872 Venous insufficiency (chronic) (peripheral): Secondary | ICD-10-CM | POA: Insufficient documentation
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
## 2022-01-15 ENCOUNTER — Encounter: Payer: Commercial Managed Care - HMO | Admitting: Sports Medicine

## 2022-01-16 ENCOUNTER — Ambulatory Visit (INDEPENDENT_AMBULATORY_CARE_PROVIDER_SITE_OTHER): Payer: Commercial Managed Care - HMO | Admitting: Sports Medicine

## 2022-01-16 ENCOUNTER — Encounter: Payer: Self-pay | Admitting: Sports Medicine

## 2022-01-16 VITALS — BP 117/72 | HR 81 | Ht 75.0 in | Wt 261.0 lb

## 2022-01-16 DIAGNOSIS — Z6830 Body mass index (BMI) 30.0-30.9, adult: Secondary | ICD-10-CM | POA: Diagnosis not present

## 2022-01-16 DIAGNOSIS — Z0000 Encounter for general adult medical examination without abnormal findings: Secondary | ICD-10-CM

## 2022-01-16 DIAGNOSIS — E66811 Obesity, class 1: Secondary | ICD-10-CM

## 2022-01-16 DIAGNOSIS — E230 Hypopituitarism: Secondary | ICD-10-CM

## 2022-01-16 DIAGNOSIS — E6609 Other obesity due to excess calories: Secondary | ICD-10-CM | POA: Diagnosis not present

## 2022-01-16 DIAGNOSIS — F411 Generalized anxiety disorder: Secondary | ICD-10-CM

## 2022-01-16 DIAGNOSIS — F909 Attention-deficit hyperactivity disorder, unspecified type: Secondary | ICD-10-CM

## 2022-01-16 DIAGNOSIS — I872 Venous insufficiency (chronic) (peripheral): Secondary | ICD-10-CM | POA: Diagnosis not present

## 2022-01-16 DIAGNOSIS — R0789 Other chest pain: Secondary | ICD-10-CM

## 2022-01-16 MED ORDER — ALPRAZOLAM 0.5 MG PO TABS: 0.5000 mg | ORAL_TABLET | Freq: Every day | ORAL | 0 refills | Status: DC | PRN

## 2022-01-16 MED ORDER — AMPHETAMINE-DEXTROAMPHET ER 10 MG PO CP24: 10.0000 mg | ORAL_CAPSULE | Freq: Every day | ORAL | 0 refills | Status: DC

## 2022-01-16 NOTE — Progress Notes (Signed)
Subjective:    CC: Annual Physical Exam  HPI:  This patient is here for their annual physical  I reviewed the past medical history, family history, social history, surgical history, and allergies today and no changes were needed.  Please see the problem list section below in epic for further details.  Past Medical History: Past Medical History:  Diagnosis Date   Pneumonia    " as a kid"   Tibial plateau fracture, left    bicondylar   Past Surgical History: Past Surgical History:  Procedure Laterality Date   ARTERY REPAIR     right wrist   EXTERNAL FIXATION LEG Left 08/03/2019   Procedure: EXTERNAL FIXATION LEG;  Surgeon: Wylene Simmer, MD;  Location: [place];  Service: Orthopedics;  Laterality: Left;   EXTERNAL FIXATION LEG Left 08/04/2019   Procedure: ADJUSTMENT OF EXTERNAL FIXATION;  Surgeon: Shona Needles, MD;  Location: [place];  Service: Orthopedics;  Laterality: Left;   EXTERNAL FIXATION REMOVAL Left 08/16/2019   Procedure: REMOVAL EXTERNAL FIXATION LEG;  Surgeon: Shona Needles, MD;  Location: [place];  Service: Orthopedics;  Laterality: Left;   HERNIA REPAIR     ORIF TIBIA PLATEAU Left 08/17/2019   OPEN REDUCTION INTERNAL FIXATION (ORIF) BICONDYLAR TIBIAL PLATEAU (Left )   ORIF TIBIA PLATEAU Left 08/16/2019   Procedure: OPEN REDUCTION INTERNAL FIXATION (ORIF) BICONDYLAR TIBIAL PLATEAU;  Surgeon: Shona Needles, MD;  Location: [facility];  Service: Orthopedics;  Laterality: Left;   WISDOM TOOTH EXTRACTION     Social History: Social History   Socioeconomic History   Marital status: Divorced    Spouse name: Not on file   Number of children: 2   Years of education: 11   Highest education level: GED or equivalent  Occupational History   Occupation: Arts administrator    Comment: self-employed  Tobacco Use   Smoking status: Every Day    Types: Cigars   Smokeless tobacco: Never   Tobacco comments:    daily cigars   Vaping Use   Vaping Use: Some days   Substances: Nicotine,  Flavoring  Substance and Sexual Activity   Alcohol use: Yes    Alcohol/week: 28.0 standard drinks of alcohol    Types: 28 Shots of liquor per week    Comment: 1  finger bourbons per night   Drug use: No   Sexual activity: Yes  Other Topics Concern   Not on file  Social History Narrative   Not on file   Social Determinants of Health   Financial Resource Strain: Not on file  Food Insecurity: Not on file  Transportation Needs: Not on file  Physical Activity: Not on file  Stress: Not on file  Social Connections: Not on file   Family History: Family History  Problem Relation Age of Onset   Infertility Other    Allergies: Allergies  Allergen Reactions   Ceclor [Cefaclor]    Medications: See med rec.  Review of Systems: No headache, visual changes, nausea, vomiting, diarrhea, constipation, dizziness, abdominal pain, skin rash, fevers, chills, night sweats, swollen lymph nodes, weight loss, chest pain, body aches, joint swelling, muscle aches, shortness of breath, mood changes, visual or auditory hallucinations.  Objective:    General: Well Developed, well nourished, and in no acute distress.  Neuro: Alert and oriented x3, extra-ocular muscles intact, sensation grossly intact. Cranial nerves II through XII are intact, motor, sensory, and coordinative functions are all intact. HEENT: Normocephalic, atraumatic, pupils equal round reactive to light, neck supple,  no masses, no lymphadenopathy, thyroid nonpalpable. Oropharynx, nasopharynx, external ear canals are unremarkable. Skin: Warm and dry, no rashes noted.  Cardiac: Regular rate and rhythm, no murmurs rubs or gallops.  Respiratory: Clear to auscultation bilaterally. Not using accessory muscles, speaking in full sentences.  Abdominal: Soft, nontender, nondistended, positive bowel sounds, no masses, no organomegaly.  Musculoskeletal: Shoulder, elbow, wrist, hip, knee, ankle stable, and with full range of motion.  Impression and  Recommendations:    The patient was counselled, risk factors were discussed, anticipatory guidance given.  Annual physical exam Annual physical as above, checking routine labs.  Venous stasis dermatitis of left lower extremity Joe has chronic venous insufficiency bilaterally, left worse than right, the left side is posttraumatic. He did have some ulcers recently that seem to have scabbed over, I do not think this is bad enough currently to do an Unna boot so he will be very consistent with his compression stockings and elevation. We will also help him work on some weight loss.  Generalized anxiety disorder Currently doing some behavioral therapy. He is hesitant to do BuSpar but he will look it up and let me know, we will do low-dose alprazolam number 10/month for now. I also recommended an exercise prescription.  Adult ADHD Joe does have a previous diagnosis of adult ADHD, he has used Adderall in the past, he tells me focus is needed and will likely help his anxiety, I am happy to do a trial of Adderall, we will start with 10 mg daily, we will do a 14-day supply with a dose titration if needed. He was complaining of some chest pain so he understands not to start the Adderall until we have done his stress test.  Atypical chest pain Joe is under a great deal of stress, he has had a few episodes of substernal chest pain with radiation to the right upper chest, these typically do not come with exertion, not relieved with rest and do not come with nausea, diaphoresis, arm or neck pain. I think this is mostly related to anxiety, we will restratify him however with labs and we will go ahead and get a exercise treadmill stress test.  Obesity Joe does need to lose a lot of weight, I think the Adderall will help, he agrees to an exercise prescription, and a 10 pound weight loss over the next 2 to 3 months. If insufficient weight loss we may need to consider a  GLP-1.  ____________________________________________ Gwen Her. Dianah Field, M.D., ABFM., CAQSM., AME. Primary Care and Sports Medicine [facility]  Adjunct Professor of [place] [employer] [facility] [employer] of Medicine  Risk manager

## 2022-01-16 NOTE — Assessment & Plan Note (Signed)
Joe does have a previous diagnosis of adult ADHD, he has used Adderall in the past, he tells me focus is needed and will likely help his anxiety, I am happy to do a trial of Adderall, we will start with 10 mg daily, we will do a 14-day supply with a dose titration if needed. He was complaining of some chest pain so he understands not to start the Adderall until we have done his stress test.

## 2022-01-16 NOTE — Assessment & Plan Note (Signed)
Jeffery Cooley does need to lose a lot of weight, I think the Adderall will help, he agrees to an exercise prescription, and a 10 pound weight loss over the next 2 to 3 months. If insufficient weight loss we may need to consider a GLP-1.

## 2022-01-16 NOTE — Assessment & Plan Note (Signed)
Jeffery Cooley is under a great deal of stress, he has had a few episodes of substernal chest pain with radiation to the right upper chest, these typically do not come with exertion, not relieved with rest and do not come with nausea, diaphoresis, arm or neck pain. I think this is mostly related to anxiety, we will restratify him however with labs and we will go ahead and get a exercise treadmill stress test.

## 2022-01-16 NOTE — Assessment & Plan Note (Signed)
Jeffery Cooley has chronic venous insufficiency bilaterally, left worse than right, the left side is posttraumatic. He did have some ulcers recently that seem to have scabbed over, I do not think this is bad enough currently to do an Unna boot so he will be very consistent with his compression stockings and elevation. We will also help him work on some weight loss.

## 2022-01-16 NOTE — Assessment & Plan Note (Signed)
Annual physical as above, checking routine labs. 

## 2022-01-16 NOTE — Assessment & Plan Note (Signed)
Currently doing some behavioral therapy. He is hesitant to do BuSpar but he will look it up and let me know, we will do low-dose alprazolam number 10/month for now. I also recommended an exercise prescription.

## 2022-01-21 LAB — LIPID PANEL
Cholesterol: 185 mg/dL (ref <200)
HDL: 48 mg/dL (ref >=40)
LDL Cholesterol (Calc): 118 mg/dL (calc) — ABNORMAL HIGH
Non-HDL Cholesterol (Calc): 137 mg/dL (calc) — ABNORMAL HIGH (ref <130)
Total CHOL/HDL Ratio: 3.9 (calc) (ref <5.0)
Triglycerides: 88 mg/dL (ref <150)

## 2022-01-21 LAB — CBC
HCT: 47.9 % (ref 38.5–50.0)
Hemoglobin: 16.4 g/dL (ref 13.2–17.1)
MCH: 32.3 pg (ref 27.0–33.0)
MCHC: 34.2 g/dL (ref 32.0–36.0)
MCV: 94.3 fL (ref 80.0–100.0)
MPV: 9.2 fL (ref 7.5–12.5)
Platelets: 209 10*3/uL (ref 140–400)
RBC: 5.08 10*6/uL (ref 4.20–5.80)
RDW: 11.8 % (ref 11.0–15.0)
WBC: 5.5 10*3/uL (ref 3.8–10.8)

## 2022-01-21 LAB — COMPLETE METABOLIC PANEL WITH GFR
AG Ratio: 2 (calc) (ref 1.0–2.5)
ALT: 30 U/L (ref 9–46)
AST: 22 U/L (ref 10–40)
Albumin: 4.7 g/dL (ref 3.6–5.1)
Alkaline phosphatase (APISO): 49 U/L (ref 36–130)
BUN: 14 mg/dL (ref 7–25)
CO2: 28 mmol/L (ref 20–32)
Calcium: 9.1 mg/dL (ref 8.6–10.3)
Chloride: 105 mmol/L (ref 98–110)
Creat: 0.88 mg/dL (ref 0.60–1.29)
Globulin: 2.4 g/dL (calc) (ref 1.9–3.7)
Glucose, Bld: 100 mg/dL — ABNORMAL HIGH (ref 65–99)
Potassium: 5 mmol/L (ref 3.5–5.3)
Sodium: 140 mmol/L (ref 135–146)
Total Bilirubin: 0.3 mg/dL (ref 0.2–1.2)
Total Protein: 7.1 g/dL (ref 6.1–8.1)
eGFR: 109 mL/min/{1.73_m2} (ref >=60)

## 2022-01-21 LAB — TSH: TSH: 1.57 mIU/L (ref 0.40–4.50)

## 2022-01-21 LAB — HEMOGLOBIN A1C
Hgb A1c MFr Bld: 5 % of total Hgb (ref <5.7)
Mean Plasma Glucose: 97 mg/dL
eAG (mmol/L): 5.4 mmol/L

## 2022-01-21 LAB — TESTOSTERONE, FREE & TOTAL
Free Testosterone: 108 pg/mL (ref 35.0–155.0)
Testosterone, Total, LC-MS-MS: 656 ng/dL (ref 250–1100)

## 2022-01-31 ENCOUNTER — Other Ambulatory Visit (HOSPITAL_BASED_OUTPATIENT_CLINIC_OR_DEPARTMENT_OTHER): Payer: Self-pay

## 2022-01-31 ENCOUNTER — Encounter: Payer: Self-pay | Admitting: Sports Medicine

## 2022-01-31 DIAGNOSIS — R0789 Other chest pain: Secondary | ICD-10-CM

## 2022-02-01 ENCOUNTER — Other Ambulatory Visit (HOSPITAL_BASED_OUTPATIENT_CLINIC_OR_DEPARTMENT_OTHER): Payer: Self-pay

## 2022-02-04 ENCOUNTER — Other Ambulatory Visit (HOSPITAL_BASED_OUTPATIENT_CLINIC_OR_DEPARTMENT_OTHER): Payer: Self-pay

## 2022-02-05 ENCOUNTER — Other Ambulatory Visit (HOSPITAL_BASED_OUTPATIENT_CLINIC_OR_DEPARTMENT_OTHER): Payer: Self-pay

## 2022-02-06 ENCOUNTER — Other Ambulatory Visit (HOSPITAL_BASED_OUTPATIENT_CLINIC_OR_DEPARTMENT_OTHER): Payer: Self-pay

## 2022-03-10 ENCOUNTER — Other Ambulatory Visit: Payer: Self-pay | Admitting: Sports Medicine

## 2022-04-18 ENCOUNTER — Ambulatory Visit (HOSPITAL_COMMUNITY): Payer: Commercial Managed Care - HMO | Attending: Cardiology

## 2022-04-18 ENCOUNTER — Encounter (HOSPITAL_COMMUNITY): Payer: Commercial Managed Care - HMO

## 2022-04-18 DIAGNOSIS — R0789 Other chest pain: Secondary | ICD-10-CM

## 2022-04-19 LAB — EXERCISE TOLERANCE TEST
Angina Index: 0
Duke Treadmill Score: 8
Estimated workload: 9.8
Exercise duration (min): 7 min
Exercise duration (sec): 52 s
MPHR: 177 {beats}/min
Peak HR: 179 {beats}/min
Percent HR: 101 %
Rest HR: 78 {beats}/min
ST Depression (mm): 0 mm

## 2022-04-20 ENCOUNTER — Encounter (INDEPENDENT_AMBULATORY_CARE_PROVIDER_SITE_OTHER): Payer: Commercial Managed Care - HMO | Admitting: Sports Medicine

## 2022-04-20 DIAGNOSIS — F909 Attention-deficit hyperactivity disorder, unspecified type: Secondary | ICD-10-CM

## 2022-04-23 MED ORDER — AMPHETAMINE-DEXTROAMPHET ER 20 MG PO CP24: 20.0000 mg | ORAL_CAPSULE | Freq: Every day | ORAL | 0 refills | Status: DC

## 2022-05-02 MED ORDER — AMPHETAMINE-DEXTROAMPHET ER 20 MG PO CP24: 20.0000 mg | ORAL_CAPSULE | Freq: Every day | ORAL | 0 refills | Status: DC

## 2022-05-02 NOTE — Addendum Note (Signed)
Addended by: Silverio Decamp on: 05/02/2022 12:45 PM   Modules accepted: Orders

## 2022-05-13 ENCOUNTER — Other Ambulatory Visit: Payer: Self-pay | Admitting: Sports Medicine

## 2022-05-20 MED ORDER — AMPHETAMINE-DEXTROAMPHET ER 30 MG PO CP24: 30.0000 mg | ORAL_CAPSULE | Freq: Every day | ORAL | 0 refills | Status: DC

## 2022-05-20 NOTE — Addendum Note (Signed)
Addended by: Silverio Decamp on: 05/20/2022 09:24 AM   Modules accepted: Orders

## 2022-05-20 NOTE — Telephone Encounter (Signed)
I spent 5 total minutes of online digital evaluation and management services in this patient-initiated request for online care. 

## 2022-06-10 MED ORDER — AMPHETAMINE-DEXTROAMPHET ER 30 MG PO CP24: 30.0000 mg | ORAL_CAPSULE | Freq: Every day | ORAL | 0 refills | Status: DC

## 2022-06-10 NOTE — Addendum Note (Signed)
Addended by: Silverio Decamp on: 06/10/2022 04:31 PM   Modules accepted: Orders

## 2022-06-27 ENCOUNTER — Other Ambulatory Visit: Payer: Self-pay | Admitting: Sports Medicine

## 2022-06-27 NOTE — Addendum Note (Signed)
Addended by: Narda Rutherford on: 06/27/2022 10:38 AM   Modules accepted: Orders

## 2022-06-30 MED ORDER — AMPHETAMINE-DEXTROAMPHET ER 30 MG PO CP24: 30.0000 mg | ORAL_CAPSULE | Freq: Every day | ORAL | 0 refills | Status: DC

## 2022-06-30 NOTE — Addendum Note (Signed)
Addended by: Silverio Decamp on: 06/30/2022 09:25 PM   Modules accepted: Orders

## 2022-07-04 ENCOUNTER — Encounter: Payer: Self-pay | Admitting: Sports Medicine

## 2022-07-04 DIAGNOSIS — F411 Generalized anxiety disorder: Secondary | ICD-10-CM

## 2022-07-04 MED ORDER — CLOMIPHENE CITRATE 50 MG PO TABS: 25.0000 mg | ORAL_TABLET | Freq: Every day | ORAL | 3 refills | Status: AC

## 2022-07-04 MED ORDER — TADALAFIL 5 MG PO TABS
5.0000 mg | ORAL_TABLET | Freq: Every day | ORAL | 3 refills | Status: AC
Start: 2022-07-04 — End: ?

## 2022-07-04 MED ORDER — ALPRAZOLAM 0.5 MG PO TABS: 0.5000 mg | ORAL_TABLET | Freq: Every day | ORAL | 0 refills | Status: AC | PRN

## 2022-07-29 ENCOUNTER — Encounter: Payer: Self-pay | Admitting: Sports Medicine

## 2022-07-29 DIAGNOSIS — F909 Attention-deficit hyperactivity disorder, unspecified type: Secondary | ICD-10-CM

## 2022-07-29 MED ORDER — AMPHETAMINE-DEXTROAMPHET ER 30 MG PO CP24: 30.0000 mg | ORAL_CAPSULE | Freq: Every day | ORAL | 0 refills | Status: AC

## 2022-07-29 NOTE — Telephone Encounter (Signed)
Patient requesting rx rf of Adderall XR 30mg  Last written 06/30/2022 Last OV 01/16/2022 No upcoming appt shceduled.

## 2022-07-30 NOTE — Telephone Encounter (Signed)
Sent patient message via Mychart regarding refill.

## 2022-08-13 ENCOUNTER — Ambulatory Visit (INDEPENDENT_AMBULATORY_CARE_PROVIDER_SITE_OTHER): Payer: Commercial Managed Care - HMO

## 2022-08-13 ENCOUNTER — Ambulatory Visit: Payer: Commercial Managed Care - HMO | Admitting: Sports Medicine

## 2022-08-13 DIAGNOSIS — M545 Low back pain, unspecified: Secondary | ICD-10-CM

## 2022-08-13 DIAGNOSIS — M5136 Other intervertebral disc degeneration, lumbar region with discogenic back pain only: Secondary | ICD-10-CM | POA: Insufficient documentation

## 2022-08-13 DIAGNOSIS — G8929 Other chronic pain: Secondary | ICD-10-CM

## 2022-08-13 DIAGNOSIS — M25572 Pain in left ankle and joints of left foot: Secondary | ICD-10-CM

## 2022-08-13 DIAGNOSIS — M25571 Pain in right ankle and joints of right foot: Secondary | ICD-10-CM

## 2022-08-13 DIAGNOSIS — L600 Ingrowing nail: Secondary | ICD-10-CM | POA: Diagnosis not present

## 2022-08-13 DIAGNOSIS — I872 Venous insufficiency (chronic) (peripheral): Secondary | ICD-10-CM

## 2022-08-13 DIAGNOSIS — Z8781 Personal history of (healed) traumatic fracture: Secondary | ICD-10-CM

## 2022-08-13 DIAGNOSIS — Z9889 Other specified postprocedural states: Secondary | ICD-10-CM

## 2022-08-13 DIAGNOSIS — E790 Hyperuricemia without signs of inflammatory arthritis and tophaceous disease: Secondary | ICD-10-CM

## 2022-08-13 DIAGNOSIS — Z09 Encounter for follow-up examination after completed treatment for conditions other than malignant neoplasm: Secondary | ICD-10-CM | POA: Diagnosis not present

## 2022-08-13 MED ORDER — DOXYCYCLINE HYCLATE 100 MG PO TABS: 100.0000 mg | ORAL_TABLET | Freq: Two times a day (BID) | ORAL | 0 refills | Status: AC

## 2022-08-13 MED ORDER — ALL-BODY MASSAGE MISC: 0 refills | Status: AC

## 2022-08-13 MED ORDER — PREDNISONE 50 MG PO TABS: ORAL_TABLET | ORAL | 0 refills | Status: AC

## 2022-08-13 MED ORDER — MELOXICAM 15 MG PO TABS: ORAL_TABLET | ORAL | 3 refills | Status: AC

## 2022-08-13 NOTE — Assessment & Plan Note (Signed)
Acute discogenic low back pain, nothing radicular, no red flag symptoms, adding prednisone, meloxicam, x-rays, home PT, return to see me in 4 to 6 weeks, MRI if no better.

## 2022-08-13 NOTE — Progress Notes (Addendum)
    Procedures performed today:    None.  Independent interpretation of notes and tests performed by another provider:   None.  Brief History, Exam, Impression, and Recommendations:    Bilateral ankle pain Chronic bilateral ankle pain localized to the tibiotalar joint. He is concerned about gout as his pain does seem to worsen when eating pork, he does not have an effusion, it is not hot, it is not red and explained him that gout typically occurred in the first MTP and knee, pseudogout certainly could present in the ankle but I do not think this is a pseudogout flare currently. I will check his uric acid levels. Adding ankle x-rays, he is doing prednisone for his back pain so I think this will help his ankles as well.  Venous stasis dermatitis of left lower extremity Chronic venous insufficiency, bilateral left worse than right, he did have some ulcers that are scabbed over, compression stockings have not helped. I would like [facility].  Discogenic low back pain Acute discogenic low back pain, nothing radicular, no red flag symptoms, adding prednisone, meloxicam, x-rays, home PT, return to see me in 4 to 6 weeks, MRI if no better.  Ingrown toenail of both feet Bilateral ingrown great toenails, likely medial paronychia on the right foot, adding doxycycline and a referral to podiatry.  Hyperuricemia Patient has a elevated uric acid levels however this does not automatically indicate a diagnosis of gout, however elevated uric acid levels can create widespread aches and pains so we will add allopurinol, recheck in a month with a goal of less than 5, we can reevaluate joint aches and pains when uric acid is controlled.  We did discuss dietary changes and cutting back alcohol.    ____________________________________________ [name]. Benjamin Stain, M.D., ABFM., CAQSM., AME. Primary Care and Sports Medicine [facility]  [facility]  Adjunct Professor of Family Medicine  [place] [employer] [facility] [employer] Medicine  Restaurant manager, fast food

## 2022-08-13 NOTE — Assessment & Plan Note (Signed)
Chronic bilateral ankle pain localized to the tibiotalar joint. He is concerned about gout as his pain does seem to worsen when eating pork, he does not have an effusion, it is not hot, it is not red and explained him that gout typically occurred in the first MTP and knee, pseudogout certainly could present in the ankle but I do not think this is a pseudogout flare currently. I will check his uric acid levels. Adding ankle x-rays, he is doing prednisone for his back pain so I think this will help his ankles as well.

## 2022-08-13 NOTE — Assessment & Plan Note (Signed)
Chronic venous insufficiency, bilateral left worse than right, he did have some ulcers that are scabbed over, compression stockings have not helped. I would like him to touch base with the [facility].

## 2022-08-13 NOTE — Addendum Note (Signed)
Addended by: Monica Becton on: 08/13/2022 02:51 PM   Modules accepted: Orders

## 2022-08-13 NOTE — Assessment & Plan Note (Signed)
Bilateral ingrown great toenails, likely medial paronychia on the right foot, adding doxycycline and a referral to podiatry.

## 2022-08-14 DIAGNOSIS — E790 Hyperuricemia without signs of inflammatory arthritis and tophaceous disease: Secondary | ICD-10-CM | POA: Insufficient documentation

## 2022-08-14 LAB — COMPREHENSIVE METABOLIC PANEL
AG Ratio: 2.1 (calc) (ref 1.0–2.5)
ALT: 35 U/L (ref 9–46)
AST: 27 U/L (ref 10–40)
Albumin: 5.3 g/dL — ABNORMAL HIGH (ref 3.6–5.1)
Alkaline phosphatase (APISO): 50 U/L (ref 36–130)
BUN: 10 mg/dL (ref 7–25)
CO2: 24 mmol/L (ref 20–32)
Calcium: 9.8 mg/dL (ref 8.6–10.3)
Chloride: 98 mmol/L (ref 98–110)
Creat: 1 mg/dL (ref 0.60–1.29)
Globulin: 2.5 g/dL (calc) (ref 1.9–3.7)
Glucose, Bld: 72 mg/dL (ref 65–99)
Potassium: 5 mmol/L (ref 3.5–5.3)
Sodium: 135 mmol/L (ref 135–146)
Total Bilirubin: 1 mg/dL (ref 0.2–1.2)
Total Protein: 7.8 g/dL (ref 6.1–8.1)

## 2022-08-14 LAB — CBC
HCT: 53.9 % — ABNORMAL HIGH (ref 38.5–50.0)
Hemoglobin: 18 g/dL — ABNORMAL HIGH (ref 13.2–17.1)
MCH: 31 pg (ref 27.0–33.0)
MCHC: 33.4 g/dL (ref 32.0–36.0)
MCV: 92.9 fL (ref 80.0–100.0)
MPV: 9 fL (ref 7.5–12.5)
Platelets: 231 10*3/uL (ref 140–400)
RBC: 5.8 10*6/uL (ref 4.20–5.80)
RDW: 11.6 % (ref 11.0–15.0)
WBC: 7.8 10*3/uL (ref 3.8–10.8)

## 2022-08-14 LAB — SEDIMENTATION RATE: Sed Rate: 6 mm/h (ref 0–15)

## 2022-08-14 LAB — URIC ACID: Uric Acid, Serum: 11.6 mg/dL — ABNORMAL HIGH (ref 4.0–8.0)

## 2022-08-14 MED ORDER — ALLOPURINOL 300 MG PO TABS: 300.0000 mg | ORAL_TABLET | Freq: Every day | ORAL | 6 refills | Status: AC

## 2022-08-14 NOTE — Assessment & Plan Note (Signed)
Patient has a elevated uric acid levels however this does not automatically indicate a diagnosis of gout, however elevated uric acid levels can create widespread aches and pains so we will add allopurinol, recheck in a month with a goal of less than 5, we can reevaluate joint aches and pains when uric acid is controlled.  We did discuss dietary changes and cutting back alcohol.

## 2022-08-14 NOTE — Addendum Note (Signed)
Addended by: Monica Becton on: 08/14/2022 08:32 AM   Modules accepted: Orders

## 2022-08-16 ENCOUNTER — Ambulatory Visit: Payer: Commercial Managed Care - HMO | Admitting: Sports Medicine

## 2022-08-22 ENCOUNTER — Encounter: Payer: Self-pay | Admitting: Sports Medicine

## 2022-08-22 DIAGNOSIS — L600 Ingrowing nail: Secondary | ICD-10-CM

## 2022-09-24 ENCOUNTER — Ambulatory Visit: Payer: Commercial Managed Care - HMO | Admitting: Sports Medicine

## 2023-12-14 IMAGING — US US EXTREM LOW VENOUS*L*
1 series · 13 of 24 positions shown · non-contrast
Comparison: None.

CLINICAL DATA: Left lower extremity DVT



[Series 1: us venous img lower uni left (dvt) · portal-venous · 13 of 51 slices shown]
[im 1/51]
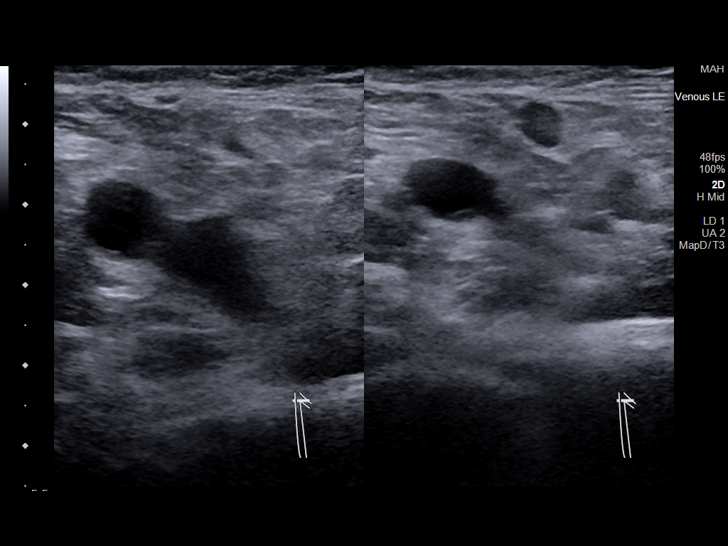
[im 5/51]
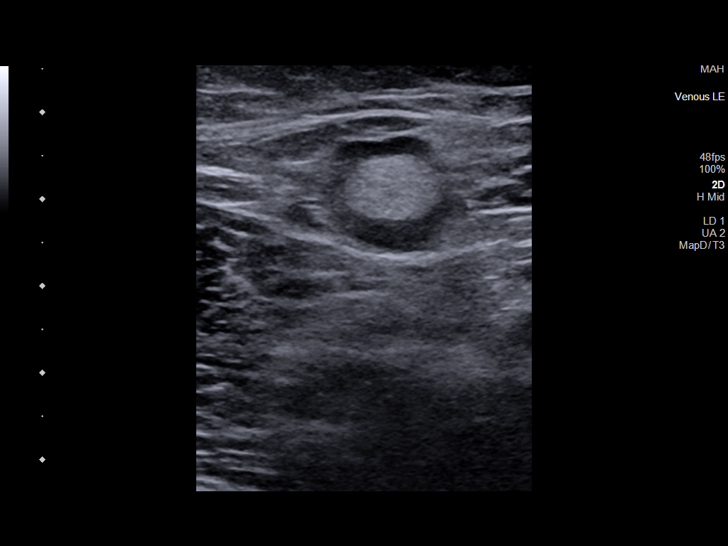
[im 9/51]
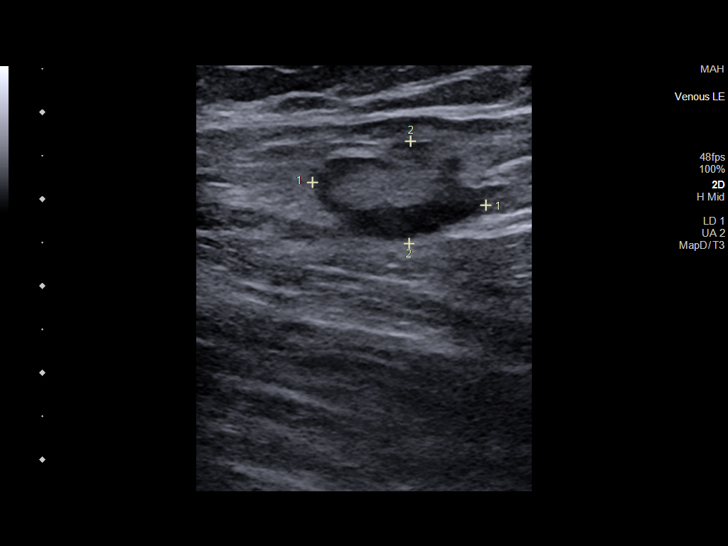
[im 14/51]
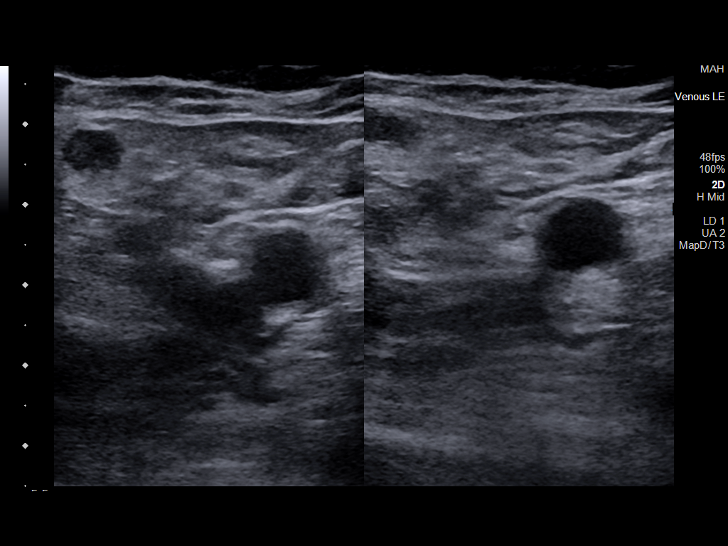
[im 18/51]
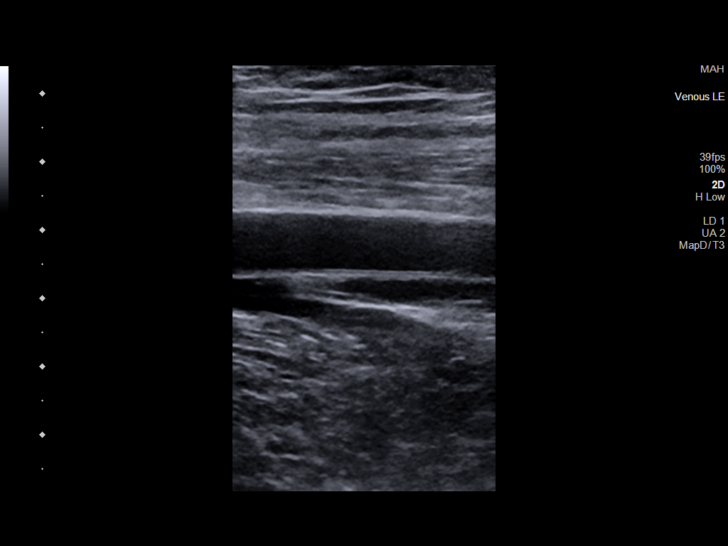
[im 22/51]
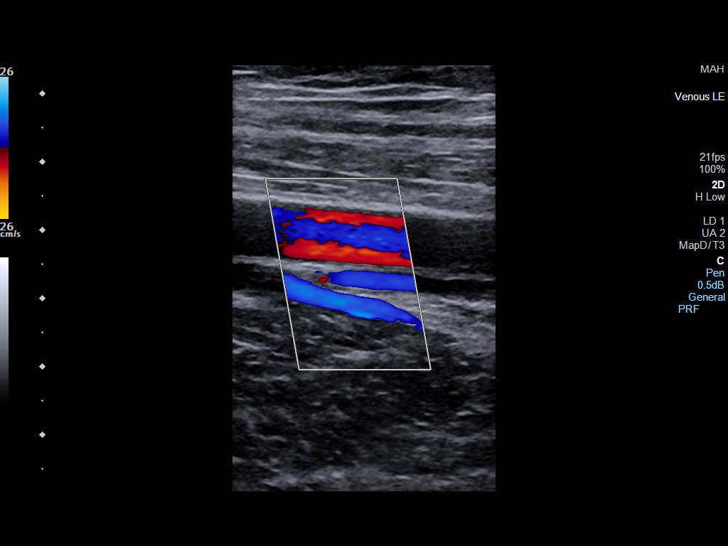
[im 27/51]
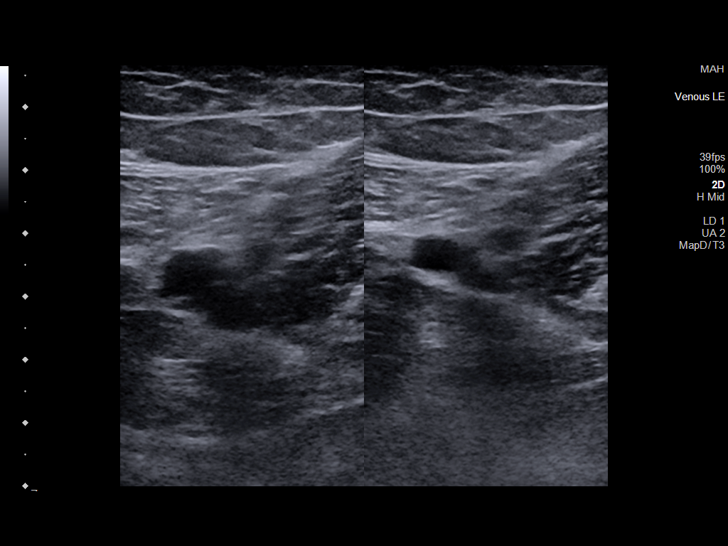
[im 29/51]
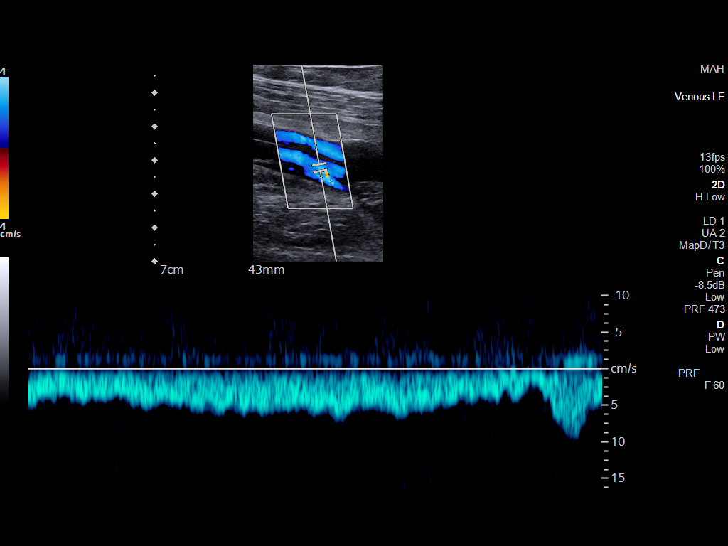
[im 33/51]
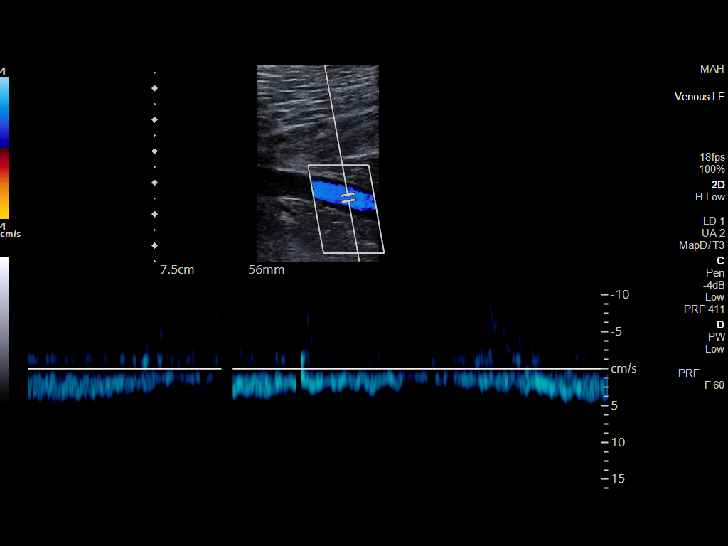
[im 37/51]
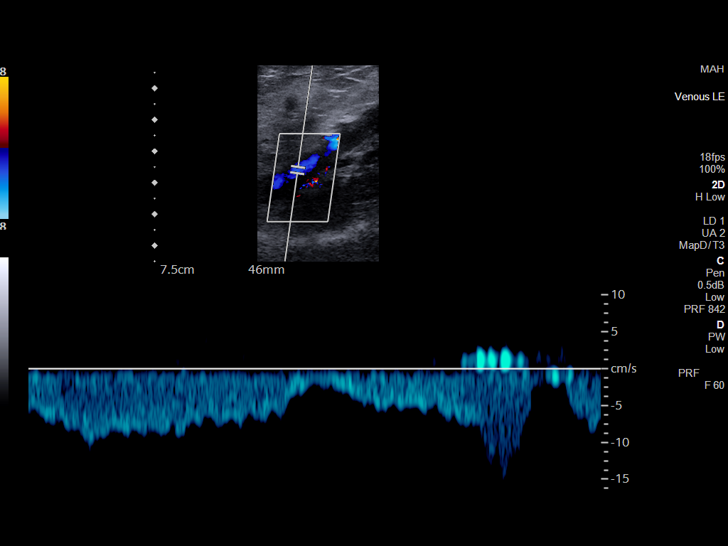
[im 42/51]
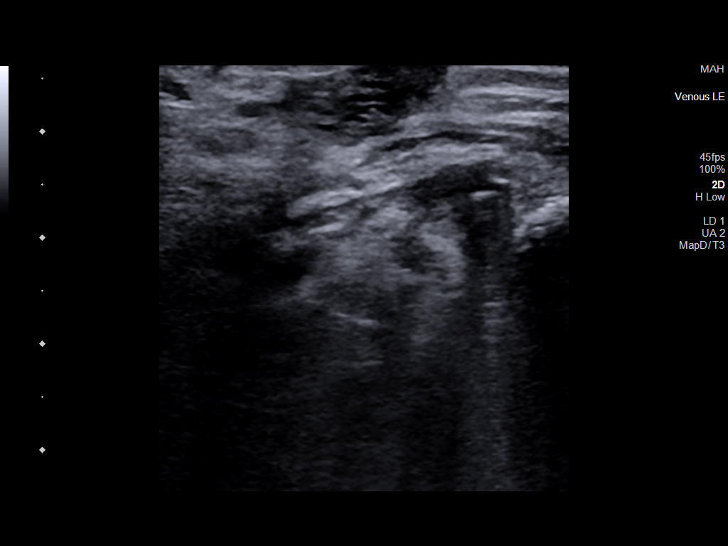
[im 46/51]
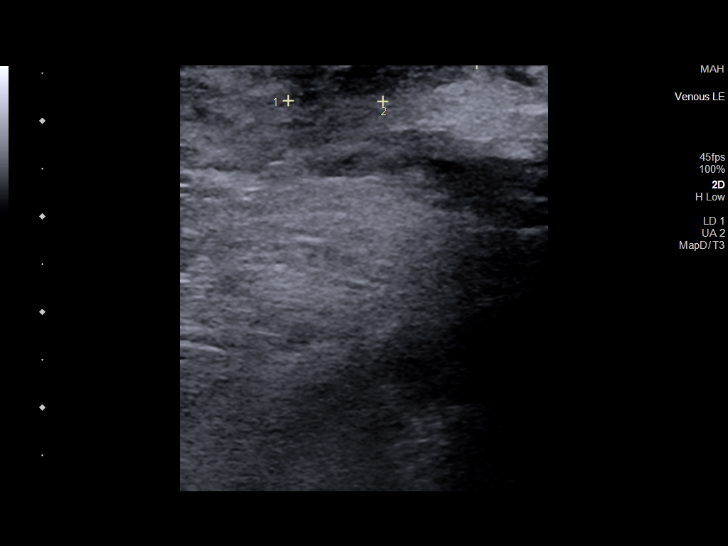
[im 51/51]
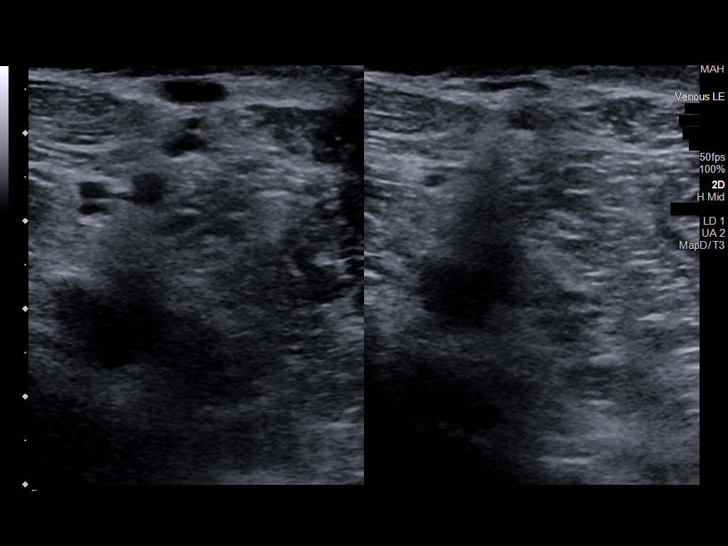

[13 of 24 positions shown; findings below may reference images not displayed]

FINDINGS: Contralateral Common Femoral Vein: Respiratory phasicity is normal
and symmetric with the symptomatic side. No evidence of thrombus.
Normal compressibility.

Common Femoral Vein: No evidence of thrombus. Normal
compressibility, respiratory phasicity and response to augmentation.

Saphenofemoral Junction: No evidence of thrombus. Normal
compressibility and flow on color Doppler imaging.

Profunda Femoral Vein: No evidence of thrombus. Normal
compressibility and flow on color Doppler imaging.

Femoral Vein: No evidence of thrombus. Normal compressibility,
respiratory phasicity and response to augmentation.

Popliteal Vein: No evidence of thrombus. Normal compressibility,
respiratory phasicity and response to augmentation.

Calf Veins: Not well visualized.

Other Findings: Normal left groin lymph node. Scar tissue along the
left medial knee.
IMPRESSION: No evidence of DVT involving the popliteal vein through the common
femoral vein. The calf veins are not well visualized on this exam.

## 2023-12-30 ENCOUNTER — Encounter: Payer: Self-pay | Admitting: Sports Medicine
